# Patient Record
Sex: Female | Born: 1945 | Race: Black or African American | Smoking: Never smoker
Health system: Northeastern US, Academic
[De-identification: ages and names within clinical notes are randomized; demographics above are authoritative.]

## PROBLEM LIST (undated history)

## (undated) DIAGNOSIS — E119 Type 2 diabetes mellitus without complications: Secondary | ICD-10-CM

## (undated) DIAGNOSIS — I10 Essential (primary) hypertension: Secondary | ICD-10-CM

## (undated) DIAGNOSIS — C50919 Malignant neoplasm of unspecified site of unspecified female breast: Secondary | ICD-10-CM

## (undated) DIAGNOSIS — E785 Hyperlipidemia, unspecified: Secondary | ICD-10-CM

## (undated) DIAGNOSIS — R251 Tremor, unspecified: Secondary | ICD-10-CM

## (undated) DIAGNOSIS — N281 Cyst of kidney, acquired: Secondary | ICD-10-CM

## (undated) DIAGNOSIS — T464X5A Adverse effect of angiotensin-converting-enzyme inhibitors, initial encounter: Secondary | ICD-10-CM

## (undated) DIAGNOSIS — B019 Varicella without complication: Secondary | ICD-10-CM

## (undated) HISTORY — PX: BREAST LUMPECTOMY: SHX2

## (undated) HISTORY — DX: Type 2 diabetes mellitus without complications: E11.9

## (undated) HISTORY — PX: BREAST SURGERY: SHX581

## (undated) HISTORY — DX: Essential (primary) hypertension: I10

## (undated) NOTE — Progress Notes (Signed)
Formatting of this note might be different from the original.  This RT came into patient's room for vent check.  Patient's FIO2 was set at 21%.  There was no note of change.  Order had not been changed from 100% FIO2 earlier in day.  An ABG was done and the RT on dayshift had put FIO2 at 40%, but no documentation was available for change to 21%.  Changed FIO2 back to what was recorded until an order is changed.  Patient is currently in no distress, Will continue to monitor.  Electronically signed by Laurier Nancy, RRT at 06/12/2019  8:18 PM EDT

## (undated) NOTE — Progress Notes (Signed)
Formatting of this note might be different from the original.  PCCM Note :   Called by RN b/p trending up now 197/92. Did not take b/p meds this am. No improvement with labetolol . Bradycardia earlier today with precedex so now off precedex .   Patient is alert and f/c . No headaces. Denies pain.     Plan   D/c labetolol .   Begin Cardene Drip , goal b/p <160   KVO IVF to avoid volume overload     Tammy Parrett NP-C   [facility]   [phone]   06/12/2019    Electronically signed by Alyson Reedy, MD at 06/16/2019 11:01 PM EDT

## (undated) NOTE — H&P (Signed)
Formatting of this note is different from the original.  Images from the original note were not included.      NAME:  Christina [name], MRN:  [id number], DOB:  [birth year], LOS: 0  ADMISSION DATE:  06/12/2019, CONSULTATION DATE:  06/12/2019  REFERRING MD:  EDP - Dykstra, CHIEF COMPLAINT:  Acute respiratory failure due to ACE angioedema     Brief History    62 year old female with history of HTN who is on lisinopril who woke up on the morning of presentation with tongue swelling.  Patient presented to [facility] with swelling.  Tongue was enlarged enough to be beyond the lips and patient was unable to close her mouth.  Patient was transferred ER to ER where anesthesia, ENT and CCM were bedside.  Upon evaluating the patient and discussion amongst the group decision was made to take the patient to the ER with anesthesia performing and ENT bedside an endotracheal intubation then admission to the ICU for CCM to stabilize but attempt to avoid a tracheostomy at this time.    History of present illness    2 year old female with history of HTN who is on lisinopril who woke up on the morning of presentation with tongue swelling.  Patient presented to [facility] ER with swelling.  Tongue was enlarged enough to be beyond the lips and patient was unable to close her mouth.  Patient was transferred ER to ER where anesthesia, ENT and CCM were bedside.  Upon evaluating the patient and discussion amongst the group decision was made to take the patient to the ER with anesthesia performing and ENT bedside an endotracheal intubation then admission to the ICU for CCM to stabilize but attempt to avoid a tracheostomy at this time.    Past Medical History   HTN    Significant Hospital Events    10/4 acute respiratory failure from angioedema    Consults:   PCCM  Anesthesia  ENT    Procedures:   Intubation 10/4>>>    Significant Diagnostic Tests:   N/A    Micro Data:   N/A    Antimicrobials:   N/A     Interim  history/subjective:   Unable to obtain since patient is unable to speak with such a large tongue    Objective    Blood pressure (!) 191/96, pulse 92, resp. rate 13, weight 79.4 kg, SpO2 98 %.        Intake/Output Summary (Last 24 hours) at 06/12/2019 1236  Last data filed at 06/12/2019 1120  Gross per 24 hour   Intake 50 ml   Output --   Net 50 ml     Filed Weights    06/12/19 1041   Weight: 79.4 kg     Examination:  General: Acutely ill appearing female, some respiratory distress due to tongue size  HENT: NC/AT, PERRL, EOM-I and MMM.  Very enlarged tongue that is literally outside her mouth and she can not close her mouth  Lungs: Diminished but clear bilaterally  Cardiovascular: RRR, Nl S1/S2 and -M/R/G  Abdomen: Soft, NT, ND and +BS  Extremities: -edema and -tenderness  Neuro: Alert and interactive, moving all ext to command  Skin: intact    Resolved Hospital Problem list    N/A    Assessment & Plan:   49 year old female with ACE induced angioedema and respiratory failure.  Patient is to go to the OR for likely nasal intubation then transfer intubated  to the ICU.  Discussed with ENT and anesthesia.    Angioedema:   - Solumedrol 60 q4   - Pepcid 20 IV BID   - Benadryl   - Add ACE to allergy list    VDRF:   - PRVC   - ABG and CXR   - Titrate O2 for sat of 88-92%   - ABG and CXR in AM    Sedation:   - Fentanyl drip   - Precedex drip    Consult nutrition for TF per nutrition  Order admission labs  SQ heparin    Labs    CBC:  No results for input(s): WBC, NEUTROABS, HGB, HCT, MCV, PLT in the last 168 hours.    Basic Metabolic Panel:  No results for input(s): NA, K, CL, CO2, GLUCOSE, BUN, CREATININE, CALCIUM, MG, PHOS in the last 168 hours.  GFR:  CrCl cannot be calculated (No successful lab value found.).  No results for input(s): PROCALCITON, WBC, LATICACIDVEN in the last 168 hours.    Liver Function Tests:  No results for input(s): AST, ALT, ALKPHOS, BILITOT, PROT, ALBUMIN in the last 168 hours.  No results for  input(s): LIPASE, AMYLASE in the last 168 hours.  No results for input(s): AMMONIA in the last 168 hours.    ABG  No results found for: PHART, PCO2ART, PO2ART, HCO3, TCO2, ACIDBASEDEF, O2SAT     Coagulation Profile:  No results for input(s): INR, PROTIME in the last 168 hours.    Cardiac Enzymes:  No results for input(s): CKTOTAL, CKMB, CKMBINDEX, TROPONINI in the last 168 hours.    HbA1C:  No results found for: HGBA1C    CBG:  No results for input(s): GLUCAP in the last 168 hours.    Review of Systems:    Unattainable due to enlarged tongue and now sedation    Past Medical History   She,  has a past medical history of Diabetes mellitus without complication (HCC) and Hypertension.     Surgical History    Unknown     Social History       Family History    Her family history is not on file.     Allergies  Allergies   Allergen Reactions   ? Lisinopril Anaphylaxis   ? Acetaminophen Hives     Causes wheels and hives       Home Medications   Prior to Admission medications    Medication Sig Start Date End Date Taking? Authorizing Provider   lisinopril (ZESTRIL) 10 MG tablet Take 10 mg by mouth daily.   Yes [provider]     The patient is critically ill with multiple organ systems failure and requires high complexity decision making for assessment and support, frequent evaluation and titration of therapies, application of advanced monitoring technologies and extensive interpretation of multiple databases.     Critical Care Time devoted to patient care services described in this note is  45  Minutes. This time reflects time of care of this signee Dr Koren Bound. This critical care time does not reflect procedure time, or teaching time or supervisory time of PA/NP/Med student/Med Resident etc but could involve care discussion time.    Alyson Reedy, M.D.  [facility].  Pager: [phone].  After hours pager: [phone].  Electronically signed by Alyson Reedy, MD at 06/12/2019  1:21 PM  EDT

## (undated) NOTE — Progress Notes (Signed)
Formatting of this note is different from the original.  Inpatient Diabetes Program Recommendations    AACE/ADA: New Consensus Statement on Inpatient Glycemic Control (2015)    Target Ranges:  Prepandial:   less than 140 mg/dL       Peak postprandial:   less than 180 mg/dL (1-2 hours)       Critically ill patients:  140 - 180 mg/dL     Lab Results   Component Value Date    GLUCAP 248 (H) 06/13/2019    HGBA1C 12.5 (H) 06/13/2019     Review of Glycemic Control  Results for JAYMEE, CELA (MRN [id number]) as of 06/13/2019 13:48   Ref. Range 06/12/2019 19:24 06/12/2019 23:44 06/13/2019 03:55 06/13/2019 07:26 06/13/2019 12:02   Glucose-Capillary Latest Ref Range: 70 - 99 mg/dL 409 (H) 811 (H) 914 (H) 211 (H) 248 (H)     Diabetes history: DM 2  Outpatient Diabetes medications:   Lantus 14 units q HS  Current orders for Inpatient glycemic control:  Novolog moderate q 4 hours  Lantus 5 units daily  Solumedrol 60 mg IV q 6 hours    Inpatient Diabetes Program Recommendations:      Please consider increasing Lantus to 15 units daily. It appears that patient was newly started on insulin prior to admit on 06/06/19.   Likely will need insulin increased while in the hospital.   Will follow up on 06/14/19 regarding A1C.     Thanks,   Beryl Meager, RN, BC-ADM  Inpatient Diabetes Coordinator  Pager [phone] (8a-5p)      Electronically signed by Oneida Arenas, RN at 06/13/2019  1:54 PM EDT

## (undated) NOTE — ED Provider Notes (Signed)
Formatting of this note is different from the original.  Images from the original note were not included.    [facility] EMERGENCY DEPARTMENT  Provider Note    CSN: [id number]  Arrival date & time: 06/12/19  1035        History    Chief Complaint  Chief Complaint   Patient presents with   ? Angioedema     HPI  Christina [name] is a 72 y.o. female.        The history is provided by the patient. No language interpreter was used.     Christina [name] is a 59 y.o. female who presents to the Emergency Department complaining of tongue swelling.  Level V caveat due to speech difficulties.  Pt reports tongue swelling that began about 9 am and rapidly worsened prior to ED arrival. She took two benadryl when she noticed the swelling.  Denies any swelling in her throat, chest pain, SOB, N/V.  No prior similar sxs. She has a hx/o HTN and takes an Ace inhibitor.  Sxs are severe and constant.    Past Medical History:   Diagnosis Date   ? Diabetes mellitus without complication (HCC)    ? Hypertension      There are no active problems to display for this patient.        OB History    No obstetric history on file.      Home Medications      Prior to Admission medications    Medication Sig Start Date End Date Taking? Authorizing Provider   lisinopril (ZESTRIL) 10 MG tablet Take 10 mg by mouth daily.   Yes [provider]     Family History  No family history on file.    Social History  Social History     Tobacco Use   ? Smoking status: Not on file   Substance Use Topics   ? Alcohol use: Not on file   ? Drug use: Not on file     Allergies    Patient has no allergy information on record.    Review of Systems  Review of Systems   All other systems reviewed and are negative.    Physical Exam  Updated Vital Signs  BP (!) 182/109 (BP Location: Right Arm)   Pulse (!) 103   Resp (!) 22   Wt 79.4 kg   SpO2 100%     Physical Exam  Vitals signs and nursing note reviewed.   Constitutional:       General: She is in acute distress.       Appearance: She is well-developed. She is ill-appearing.   HENT:      Head: Normocephalic and atraumatic.      Comments: Marked edema to tongue, filling entire oral cavity, unable to follow close teeth, drooling. Marked edema of submental, sublingual region.    Cardiovascular:      Rate and Rhythm: Normal rate and regular rhythm.      Heart sounds: No murmur.   Pulmonary:      Effort: Pulmonary effort is normal. No respiratory distress.      Breath sounds: Normal breath sounds. No stridor.   Abdominal:      Palpations: Abdomen is soft.      Tenderness: There is no abdominal tenderness. There is no guarding or rebound.   Musculoskeletal:         General: No swelling or tenderness.   Skin:     General: Skin  is warm and dry.   Neurological:      Mental Status: She is alert and oriented to person, place, and time.      Sensory: No sensory deficit.   Psychiatric:         Behavior: Behavior normal.     ED Treatments / Results   Labs  (all labs ordered are listed, but only abnormal results are displayed)  Labs Reviewed - No data to display    EKG  None    Radiology  No results found.    Procedures  Procedures (including critical care time)  CRITICAL CARE  Performed by: Tilden Fossa    Total critical care time: 45 minutes    Critical care time was exclusive of separately billable procedures and treating other patients.    Critical care was necessary to treat or prevent imminent or life-threatening deterioration.    Critical care was time spent personally by me on the following activities: development of treatment plan with patient and/or surrogate as well as nursing, discussions with consultants, evaluation of patient's response to treatment, examination of patient, obtaining history from patient or surrogate, ordering and performing treatments and interventions, ordering and review of laboratory studies, ordering and review of radiographic studies, pulse oximetry and re-evaluation of patient's condition.    Medications  Ordered in ED  Medications   diphenhydrAMINE (BENADRYL) 50 MG/ML injection (has no administration in time range)   famotidine (PEPCID) IVPB 20 mg premix (20 mg Intravenous New Bag/Given 06/12/19 1056)   EPINEPHrine (EPI-PEN) injection 0.3 mg (0.3 mg Intramuscular Given 06/12/19 1051)   methylPREDNISolone sodium succinate (SOLU-MEDROL) 125 mg/2 mL injection 125 mg (125 mg Intravenous Given 06/12/19 1052)     Initial Impression / Assessment and Plan / ED Course   I have reviewed the triage vital signs and the nursing notes.    Pertinent labs & imaging results that were available during my care of the patient were reviewed by me and considered in my medical decision making (see chart for details).    Clinical Course as of Jun 11 1613   Wynelle Link Jun 12, 2019   1208 ENT [facility]), myself at bedside on patient arrival; critical care [place] [place]) and anesthesia soon joined, patient will be taken to the OR for intubation and admitted to ICU afterwards    [RD]   1208 Patient with profound angioedema, breathing through nose, normal pulse oximetry, suctioning secretions    [RD]   1220 Updated daughter regarding status and plan for intubation    [RD]     Clinical Course User Index  [RD] Milagros Loll, MD       Pt with angioedema.  Significant involvement of anterior airway, which would make standard DL or video laryngoscopy impossible.  She was treated with steroids, pepcid, epi for potential allergic component but favor ACE induced angioedema as cause. She had very minimal improvement in swelling with epi.  D/w pt risks and benefits of TXA and she declines.  Plan to transfer emergency traffic to [facility] ED for further specialist mgmt, Dr. Stevie Kern accepts.      Discussed with patient's daughter, Dr. Glennie Isle patient's illness and plan for transfer for further treatment.    Final Clinical Impressions(s) / ED Diagnoses     Final diagnoses:   Angioedema, initial encounter     ED Discharge Orders     None         Tilden Fossa,  MD  06/12/19 1634    Electronically signed by  Tilden Fossa, MD at 06/12/2019  4:34 PM EDT

## (undated) NOTE — ED Notes (Signed)
Formatting of this note might be different from the original.  ED Provider at bedside.  Electronically signed by Kathryne Eriksson, RN at 06/12/2019 10:42 AM EDT

## (undated) NOTE — ED Provider Notes (Signed)
Formatting of this note might be different from the original.  Update note    Please refer to Dr. Haskel Schroeder note on same day for full ED provider note.  In brief, patient presented initially to med [facility] with sudden onset of tongue swelling concerning for angioedema, likely secondary to lisinopril use.  She was given epi, Solu-Medrol, pepcid, benadryl at [facility] with no significant change.  After I received initial call from Dr. Madilyn Hook, I notified anesthesia, ENT Lazarus Salines), critical care [place]).  All teams came to bedside upon patient's arrival at [facility].  After discussion with these teams, plan was developed to have patient go to the operating room for definitive airway management and admission to the ICU thereafter.    Milagros Loll, MD  06/12/19 1234    Electronically signed by Milagros Loll, MD at 06/12/2019 12:34 PM EDT

## (undated) NOTE — ED Notes (Signed)
Formatting of this note might be different from the original.  Pt arrived from [facility] with increased secretions and difficulty speaking.    Electronically signed by Doneta Public, RN at 06/12/2019 12:08 PM EDT

## (undated) NOTE — Procedures (Signed)
Formatting of this note might be different from the original.  Extubation Procedure Note    Patient Details:    Name: Christina [name]  DOB: 03-30-1946  MRN: [id number]    Airway Documentation:     Vent end date: 06/13/19 Vent end time: 0915     Evaluation   O2 sats: stable throughout  Complications: No apparent complications  Patient did tolerate procedure well.  Bilateral Breath Sounds: Clear, Diminished    Yes     RT extubated patient per MD order [name] 3L NC with RN at bedside. Positive cuff leak noted. Patient tolerated well and can speak. No stridor noted. Patient says her breathing is comfortable. RT will continue [name] monitor as needed.      Lura Em  06/13/2019, 9:23 AM    Electronically signed by Lura Em, RRT at 06/13/2019  9:27 AM EDT

## (undated) NOTE — Progress Notes (Signed)
Formatting of this note is different from the original.  Images from the original note were not included.      NAME:  Christina [name], MRN:  [id number], DOB:  08/26/1946, LOS: 2  ADMISSION DATE:  06/12/2019, CONSULTATION DATE:  06/12/2019  REFERRING MD:  EDP - Dykstra, CHIEF COMPLAINT:  Acute respiratory failure due to ACE angioedema     Brief History    43 yo female with hx of HTN on lisinopril had dental work on 06/10/19.  Also started on zetia 1 day prior to symptom onset.  Developed tongue swelling and difficulty breathing on 06/11/19 that progressed on 06/12/19.  Presented to [facility] ER.  Transferred to [facility] ER and had nasal intubation.  Started on solumedrol, pepcid, and benadryl.    Past Medical History   HTN, HLD, DM, Tremor, Rt breast cancer, Renal cyst, Varicella    Significant Hospital Events    10/4 Admit with acute respiratory failure from angioedema  10/5 Extubated  10/6 D/c home    Consults:   Anesthesia  ENT    Procedures:   Nasal Intubation 10/4 >> 10/5    Significant Diagnostic Tests:       Micro Data:   COVID 10/4 >> negative    Antimicrobials:       Interim history/subjective:   No problems swallowing.  Speech improved.  Walked in room w/o difficulty.  Passing urine w/o difficulty.  Denies chest pain, dyspnea, abdominal pain.    Objective    Blood pressure (!) 151/75, pulse 78, temperature 98.3 F (36.8 C), temperature source Oral, resp. rate 16, height 5\' 6"  (1.676 m), weight 77.5 kg, SpO2 97 %.        Intake/Output Summary (Last 24 hours) at 06/14/2019 0931  Last data filed at 06/14/2019 0300  Gross per 24 hour   Intake 1748.97 ml   Output 525 ml   Net 1223.97 ml     Filed Weights    06/12/19 1041 06/13/19 0215   Weight: 79.4 kg 77.5 kg     Examination:    General - alert  Eyes - pupils reactive  ENT - no sinus tenderness, no stridor  Cardiac - regular rate/rhythm, no murmur  Chest - equal breath sounds b/l, no wheezing or rales  Abdomen - soft, non tender, + bowel sounds  Extremities - no  cyanosis, clubbing, or edema  Skin - no rashes  Neuro - normal strength, moves extremities, follows commands  Psych - normal mood and behavior    Assessment & Plan:     ACE inhibitor induced angioedema with tongue swelling.  - wean off prednisone over next few days  - benadryl d/c'ed due to mild delirium on 10/5  - continue pepcid for now    Hypertension.  - continue HCTZ  - f/u with PCP (Dr. Donia Ast with [facility]) within one week to further adjust BP regimen  - suspect BP will improve some once she is off steroids    DM type II with steroid induced hyperglycemia.  - continue lantus  - resume metformin    HLD.  - would not resume zetia    Labs      CMP Latest Ref Rng & Units 06/14/2019 06/13/2019 06/13/2019   Glucose 70 - 99 mg/dL 295(A) 213(Y) -   BUN 8 - 23 mg/dL 22 21 -   Creatinine 8.65 - 1.00 mg/dL 7.84 6.96 -   Sodium 295 - 145 mmol/L 138 140 138   Potassium  3.5 - 5.1 mmol/L 3.7 2.9(L) 3.0(L)   Chloride 98 - 111 mmol/L 103 102 -   CO2 22 - 32 mmol/L 24 23 -   Calcium 8.9 - 10.3 mg/dL 1.6(X) 0.9(U) -   Total Protein 6.5 - 8.1 g/dL - - -   Total Bilirubin 0.3 - 1.2 mg/dL - - -   Alkaline Phos 38 - 126 U/L - - -   AST 15 - 41 U/L - - -   ALT 0 - 44 U/L - - -     CBC Latest Ref Rng & Units 06/14/2019 06/13/2019 06/13/2019   WBC 4.0 - 10.5 K/uL 10.1 7.0 -   Hemoglobin 12.0 - 15.0 g/dL 04.5 40.9 81.1   Hematocrit 36.0 - 46.0 % 36.4 37.2 38.0   Platelets 150 - 400 K/uL 222 219 -     ABG    Component Value Date/Time    PHART 7.499 (H) 06/13/2019 0410    PCO2ART 31.1 (L) 06/13/2019 0410    PO2ART 184.0 (H) 06/13/2019 0410    HCO3 24.2 06/13/2019 0410    TCO2 25 06/13/2019 0410    O2SAT 100.0 06/13/2019 0410     CBG (last 3)   Recent Labs     06/13/19  2352 06/14/19  0405 06/14/19  0926   GLUCAP 211* 154* 209*     Coralyn Helling, MD  [facility]  06/14/2019, 9:43 AM      Electronically signed by Coralyn Helling, MD at 06/14/2019 11:07 AM EDT

## (undated) NOTE — ED Notes (Signed)
Formatting of this note might be different from the original.  Daughter updated by this RN and EDP.  Her contact number is [phone]   Electronically signed by Doneta Public, RN at 06/12/2019 12:19 PM EDT

## (undated) NOTE — Discharge Summary (Signed)
Formatting of this note is different from the original.  Images from the original note were not included.  Physician Discharge Summary         Patient ID:  Christina [name]  MRN: [id number]  DOB/AGE: 1946/05/24 36 y.o.    Admit date: 06/12/2019  Discharge date: 06/14/2019    Discharge Diagnoses:      ACEi induced Angioedema  Acute respiratory insufficiency  Hypertension  Diabetes Type 2 / hyperglycemia    Discharge summary      53 year old female with history of HTN, HLD, hand tremors, breast cancer (2018) and diabetes type II who is on lisinopril who woke up on the morning of presentation with tongue swelling.  Patient presented to [facility] ER with swelling on 10/4.  Tongue was enlarged enough to be beyond the lips and patient was unable to close her mouth.  Patient was transferred ER to ER where anesthesia, ENT and CCM were bedside.  Upon evaluating the patient and discussion amongst the group decision was made to take the patient to the OR with anesthesia performing intubation and ENT bedside for backup endotracheal intubation or possible emergency tracheostomy.  She was successful nasally intubated by anesthesia and then transferred to ICU.  She was treated with IV solumedrol, pepcid, and benadryl.  Her hyperglycemia was controlled with sliding scale insulin and lantus.  Initial hypertension treated with cardene IV which was stopped early on 10/5.  Patient was able to be successfully extubated on 10/5 after angioedema subsided.  Unclear cause of angioedema, possibly related to ACEi however patient also states she recently started zetia after her annual visit on 06/06/2019.  Patient has remained hemodynamically stable, airway stable, and able to tolerate diet since extubation.  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  She will be discharged home and is to follow-up with her primary care physician, Dr. Aline August next week (followed at [facility]).  ACEi and zetia have  been added to her allergies and acknowledges to avoid ARBs as well.     Discharge Plan by Active Problems      ACEi induced Angioedema and respiratory insufficiency   - no residual dysphagia/ stridor/ swelling  P:   No ACEi/ ARB ever  No Zetia   Continue steroid taper over 5 days  Acknowledges to return to ER immediately if any changes    HTN  P:   Restarted on home HCTZ 25 mg daily  Continue pre-admit ASA 81mg  and KCL  To f/u with primary MD in 1 week  Encouraged to monitor BP at home, daily weights and low sodium diet    DMT2 uncontrolled  - A1C 12.5  P:   Continue home metformin  Resume home lantus, further recommendations per primary care  Patient will monitor her CBGs at home    HLD  P:   No Zetia, PCP to determine agent    Significant Hospital tests/ studies     Procedures    10/4 Nasal intubation >> 10/5    Culture data/antimicrobials    COVID 10/4 >> negative    Consults   Anesthesia  ENT     Discharge Exam:  BP (!) 151/75 (BP Location: Right Arm)   Pulse 60   Temp 98.3 F (36.8 C) (Oral)   Resp 12   Ht 5\' 6"  (1.676 m)   Wt 77.5 kg   SpO2 98%   BMI 27.58 kg/m  General:  Pleasant elderly female sitting upright in bed in NAD  HEENT: MM pink/moist, no oral swelling or stridor appreciated  Neuro: Alert and oriented, non focal, MAE  CV: s1s2 , no m/r/g  PULM:  CTA, non labored, room air  GI: soft, bsx4 active   Extremities: warm/dry, no edema   Skin: no rashes or lesions    Labs at discharge     Lab Results   Component Value Date    CREATININE 0.72 06/14/2019    BUN 22 06/14/2019    NA 138 06/14/2019    K 3.7 06/14/2019    CL 103 06/14/2019    CO2 24 06/14/2019     Lab Results   Component Value Date    WBC 10.1 06/14/2019    HGB 12.4 06/14/2019    HCT 36.4 06/14/2019    MCV 86.9 06/14/2019    PLT 222 06/14/2019     Lab Results   Component Value Date    ALT 14 06/12/2019    AST 18 06/12/2019    ALKPHOS 73 06/12/2019    BILITOT 1.2 06/12/2019     Lab Results   Component Value Date    INR 1.1 06/12/2019      Current radiological studies      Dg Chest Port 1 View    Result Date: 06/13/2019  CLINICAL DATA:  Endotracheal tube placement EXAM: PORTABLE CHEST 1 VIEW COMPARISON:  06/12/2019 FINDINGS: The endotracheal tube terminates approximately 4.4 cm above the carina. The heart size is stable. The previously noted airspace opacity in the left lower/left mid lung zone has significantly improved from prior study. The aortic calcifications are noted. There is no pneumothorax. No large pleural effusion. IMPRESSION: 1. Lines and tubes as above. 2. Interval improvement in the previously demonstrated atelectasis in the left mid lung zone. Electronically Signed   By: Katherine Mantle M.D.   On: 06/13/2019 07:57     Dg Chest Port 1 View    Result Date: 06/12/2019  CLINICAL DATA:  7 year old female with history of respiratory failure. EXAM: PORTABLE CHEST 1 VIEW COMPARISON:  No priors. FINDINGS: An endotracheal tube is in place with tip 5.6 cm above the carina. Lung volumes are low. Linear opacity in the left mid lung, likely subsegmental atelectasis. No acute consolidative airspace disease. No pleural effusions. No evidence of pulmonary edema. Heart size is normal. The patient is rotated to the right on today's exam, resulting in distortion of the mediastinal contours and reduced diagnostic sensitivity and specificity for mediastinal pathology. Atherosclerosis in the thoracic aorta. IMPRESSION: 1. Support apparatus, as above. 2. Low lung volumes with subsegmental atelectasis or scarring in the left mid lung. 3. Aortic atherosclerosis. Electronically Signed   By: Trudie Reed M.D.   On: 06/12/2019 15:21     Disposition:      Discharge disposition: 01-Home or Self Care    Allergies as of 06/14/2019       Reactions    Acetaminophen Hives, Swelling, Other (See Comments)    Lisinopril Anaphylaxis    Eggs Or Egg-derived Products Other (See Comments)    10.5.2020 Patient reports that she can tolerate the flu vaccine    Zetia  [ezetimibe]     Possible cause of angioedema        Medication List     STOP taking these medications    diphenhydrAMINE 25 mg capsule  Commonly known as: BENADRYL    ezetimibe 10 MG tablet  Commonly known as: ZETIA  lisinopril 20 MG tablet  Commonly known as: ZESTRIL      TAKE these medications    aspirin EC 81 MG tablet  Take 81 mg by mouth daily.    hydrochlorothiazide 25 MG tablet  Commonly known as: HYDRODIURIL  Take 25 mg by mouth daily.    Lantus SoloStar 100 UNIT/ML Solostar Pen  Generic drug: Insulin Glargine  Inject 14 Units into the skin at bedtime.    potassium chloride SA 20 MEQ tablet  Commonly known as: KLOR-CON  Take 20 mEq by mouth 2 (two) times daily.    predniSONE 10 MG tablet  Commonly known as: DELTASONE  Take 5 tablets (50 mg total) by mouth daily for 1 day, THEN 4 tablets (40 mg total) daily for 1 day, THEN 3 tablets (30 mg total) daily for 1 day, THEN 2 tablets (20 mg total) daily for 1 day, THEN 1 tablet (10 mg total) daily for 1 day.  Start taking on: June 15, 2019    propranolol ER 80 MG 24 hr capsule  Commonly known as: INDERAL LA  Take 80 mg by mouth daily as needed (TREMORS).      continue home metformin     Follow-up appointment    Dr. Rodena Medin at [facility]  06/22/2019 at 10 am  [phone]    Discharge Condition:     stable    Posey Boyer, MSN, AGACNP-BC  [facility]  Pgr: [phone] or if no answer [phone]  06/14/2019, 10:29 AM      Electronically signed by Coralyn Helling, MD at 06/14/2019 11:07 AM EDT    Associated attestation - Coralyn Helling, MD - 06/14/2019 11:07 AM EDT  Formatting of this note might be different from the original.  Concern for ACE inhibitor or zetia as cause of angioedema.    Resume HCTZ.  Wean off prednisone.    F/u with PCP in one week.    Coralyn Helling, MD  [facility]  06/14/2019, 11:07 AM

## (undated) NOTE — ED Notes (Signed)
Formatting of this note might be different from the original.  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

## (undated) NOTE — ED Notes (Signed)
Formatting of this note might be different from the original.  ED Provider at bedside.  Electronically signed by Kathryne Eriksson, RN at 06/12/2019 11:14 AM EDT

## (undated) NOTE — Progress Notes (Signed)
Formatting of this note might be different from the original.  Removed patient's IV in RFA. Patient dressed with assistance from daughter. I discussed discharge paperwork with daughter and patient together. All questions answered and denies any further needs. She ambulated very well out of unit with daughter  Electronically signed by Theodis Blaze, RN at 06/14/2019 10:51 AM EDT

## (undated) NOTE — ED Notes (Signed)
Formatting of this note might be different from the original.  Dr Madilyn Hook spoke with daughter, Tamera Reason on phone  Electronically signed by Kathryne Eriksson, RN at 06/12/2019 11:13 AM EDT

## (undated) NOTE — Unmapped External Note (Signed)
Formatting of this note might be different from the original.  06/12/2019    8:13 PM    Armanda Magic, Alvis Lemmings    [id number]    Pre-Op Dx:  Mouth angioedema    Post-op Dx:  same    Proc:  ENT standby     Surg:  Cephus Richer MD    Anes:  GNT    EBL:  none    Comp:  none    Findings:  Swollen base of tongue.  Normal supraglottic larynx with mobile vocal cords.    Procedure: waited in attendance in the OR while Anesthesia successfully performed a RIGHT nasal intubation.  No tracheostomy necessary    Dispo:   OR to ICU    Plan:  Angioedema management per CCM.    Flo Shanks T MD    Electronically signed by Flo Shanks, MD at 06/12/2019  8:16 PM EDT

## (undated) NOTE — Unmapped External Note (Signed)
Formatting of this note is different from the original.  [place]  52 y.o., female  [id number]      Chief Complaint: tongue swelling    HPI: 41 yo bf on Lisinopril x yrs.  Onset tongue swelling this AM.  Breathing OK .  Mild tongue pain.  Difficulty swallowing.  No prior similar episodes.  No new meds or suspect food ingestion.      PMH:  Past Medical History:   Diagnosis Date   ? Diabetes mellitus without complication (HCC)    ? Hypertension      Surg Hx:    FHx:  No family history on file.  SocHx:  has no history on file for tobacco, alcohol, and drug.    ALLERGIES:   Allergies   Allergen Reactions   ? Acetaminophen Hives, Swelling and Other (See Comments)     Allergy is NOT contained to only "Tylenol"- also causes welts/wheals, also   ? Lisinopril Anaphylaxis   ? Eggs Or Egg-Derived Products Other (See Comments)     Patient cannot eat eggs- ASK PATIENT (daughter uncertain)     Medications Prior to Admission   Medication Sig Dispense Refill   ? diphenhydrAMINE (BENADRYL) 25 mg capsule Take 50 mg by mouth as needed (for allergic reactions).     ? lisinopril (ZESTRIL) 10 MG tablet Take 10 mg by mouth daily.       Results for orders placed or performed during the hospital encounter of 06/12/19 (from the past 48 hour(s))   SARS Coronavirus 2 [facility] order, Performed in [facility] hospital lab) Nasopharyngeal Nasopharyngeal Swab     Status: None    Collection Time: 06/12/19 12:13 PM    Specimen: Nasopharyngeal Swab   Result Value Ref Range    SARS Coronavirus 2 NEGATIVE NEGATIVE     Comment: (NOTE)  If result is NEGATIVE  SARS-CoV-2 target nucleic acids are NOT DETECTED.  The SARS-CoV-2 RNA is generally detectable in upper and lower   respiratory specimens during the acute phase of infection. The lowest   concentration of SARS-CoV-2 viral copies this assay can detect is 250   copies / mL. A negative result does not preclude SARS-CoV-2 infection   and should not be used as the sole basis for treatment or other    patient management decisions.  A negative result may occur with   improper specimen collection / handling, submission of specimen other   than nasopharyngeal swab, presence of viral mutation(s) within the   areas targeted by this assay, and inadequate number of viral copies   (<250 copies / mL). A negative result must be combined with clinical   observations, patient history, and epidemiological information.  If result is POSITIVE  SARS-CoV-2 target nucleic acids are DETECTED.  The SARS-CoV-2 RNA is generally detectable in upper and lower   respiratory specimens dur  ing the acute phase of infection.  Positive   results are indicative of active infection with SARS-CoV-2.  Clinical   correlation with patient history and other diagnostic information is   necessary to determine patient infection status.  Positive results do   not rule out bacterial infection or co-infection with other viruses.  If result is PRESUMPTIVE POSTIVE  SARS-CoV-2 nucleic acids MAY BE PRESENT.    A presumptive positive result was obtained on the submitted specimen   and confirmed on repeat testing.  While 2019 novel coronavirus   (SARS-CoV-2) nucleic acids may be present in the submitted sample   additional  confirmatory testing may be necessary for epidemiological   and / or clinical management purposes  to differentiate between   SARS-CoV-2 and other Sarbecovirus currently known to infect humans.   If clinically indicated additional testing with an alternate test   methodology [phone]) is advised. The SARS-CoV-2 RNA is generally   detectable in upper and lower respiratory sp  ecimens during the acute   phase of infection.  The expected result is Negative.  Fact Sheet for Patients:  BoilerBrush.com.cy  Fact Sheet for Healthcare Providers:  https://pope.com/  This test is not yet approved or cleared by the [place] FDA and  has been authorized for detection and/or diagnosis of SARS-CoV-2  by  FDA under an Emergency Use Authorization (EUA).  This EUA will remain  in effect (meaning this test can be used) for the duration of the  COVID-19 declaration under Section 564(b)(1) of the Act, 21 U.S.C.  section 360bbb-3(b)(1), unless the authorization is terminated or  revoked sooner.  Performed at [facility], [address], [place], [place]  [address]    Glucose, capillary     Status: Abnormal    Collection Time: 06/12/19  2:57 PM   Result Value Ref Range    Glucose-Capillary 256 (H) 70 - 99 mg/dL   MRSA PCR Screening     Status: None    Collection Time: 06/12/19  3:20 PM    Specimen: Nasopharyngeal   Result Value Ref Range    MRSA by PCR NEGATIVE NEGATIVE     Comment:         The GeneXpert MRSA Assay (FDA  approved for NASAL specimens  only), is one component of a  comprehensive MRSA colonization  surveillance program. It is not  intended to diagnose MRSA  infection nor to guide or  monitor treatment for  MRSA infections.  Performed at [facility], [address] [place], [place] [address]    I-STAT 7, (LYTES, BLD GAS, ICA, H+H)     Status: Abnormal    Collection Time: 06/12/19  3:56 PM   Result Value Ref Range    pH, Arterial 7.423 7.350 - 7.450    pCO2 arterial 40.9 32.0 - 48.0 mmHg    pO2, Arterial 436.0 (H) 83.0 - 108.0 mmHg    Bicarbonate 26.7 20.0 - 28.0 mmol/L    TCO2 28 22 - 32 mmol/L    O2 Saturation 100.0 %    Acid-Base Excess 2.0 0.0 - 2.0 mmol/L    Sodium 136 135 - 145 mmol/L    Potassium 3.5 3.5 - 5.1 mmol/L    Calcium, Ion 1.16 1.15 - 1.40 mmol/L    HCT 40.0 36.0 - 46.0 %    Hemoglobin 13.6 12.0 - 15.0 g/dL    Patient temperature HIDE     Collection site RADIAL, ALLEN'S TEST ACCEPTABLE     Drawn by Operator     Sample type ARTERIAL    Comprehensive metabolic panel     Status: Abnormal    Collection Time: 06/12/19  3:58 PM   Result Value Ref Range    Sodium 137 135 - 145 mmol/L    Potassium 3.8 3.5 - 5.1 mmol/L    Chloride 100 98 - 111 mmol/L    CO2 22 22 - 32 mmol/L     Glucose, Bld 296 (H) 70 - 99 mg/dL    BUN 15 8 - 23 mg/dL    Creatinine, Ser 7.25 0.44 - 1.00 mg/dL  Calcium 8.7 (L) 8.9 - 10.3 mg/dL    Total Protein 6.5 6.5 - 8.1 g/dL    Albumin 3.5 3.5 - 5.0 g/dL    AST 18 15 - 41 U/L    ALT 14 0 - 44 U/L    Alkaline Phosphatase 73 38 - 126 U/L    Total Bilirubin 1.2 0.3 - 1.2 mg/dL    GFR calc non Af Amer >60 >60 mL/min    GFR calc Af Amer >60 >60 mL/min    Anion gap 15 5 - 15     Comment: Performed at [facility], [address], [place], [place] [address]   Magnesium     Status: Abnormal    Collection Time: 06/12/19  3:58 PM   Result Value Ref Range    Magnesium 1.4 (L) 1.7 - 2.4 mg/dL     Comment: Performed at [facility], [address], [place], [place] [address]   Phosphorus     Status: None    Collection Time: 06/12/19  3:58 PM   Result Value Ref Range    Phosphorus 3.1 2.5 - 4.6 mg/dL     Comment: Performed at [facility], [address] [place], [place] [address]   Lipase, blood     Status: None    Collection Time: 06/12/19  3:58 PM   Result Value Ref Range    Lipase 24 11 - 51 U/L     Comment: Performed at [facility], [address] [place], [place] [address]   CBC WITH DIFFERENTIAL     Status: Abnormal    Collection Time: 06/12/19  3:58 PM   Result Value Ref Range    WBC 6.1 4.0 - 10.5 K/uL     Comment: WHITE COUNT CONFIRMED ON SMEAR    RBC 4.86 3.87 - 5.11 MIL/uL    Hemoglobin 14.3 12.0 - 15.0 g/dL    HCT 29.5 62.1 - 30.8 %    MCV 88.3 80.0 - 100.0 fL    MCH 29.4 26.0 - 34.0 pg    MCHC 33.3 30.0 - 36.0 g/dL    RDW 65.7 84.6 - 96.2 %    Platelets PLATELET CLUMPS NOTED ON SMEAR, UNABLE TO ESTIMATE 150 - 400 K/uL    nRBC 0.0 0.0 - 0.2 %    Neutrophils Relative % 89 %    Neutro Abs 5.4 1.7 - 7.7 K/uL    Lymphocytes Relative 10 %    Lymphs Abs 0.6 (L) 0.7 - 4.0 K/uL    Monocytes Relative 1 %    Monocytes Absolute 0.1 0.1 - 1.0 K/uL    Eosinophils Relative 0 %    Eosinophils Absolute 0.0 0.0 - 0.5 K/uL    Basophils Relative 0 %     Basophils Absolute 0.0 0.0 - 0.1 K/uL    Immature Granulocytes 0 %    Abs Immature Granulocytes 0.01 0.00 - 0.07 K/uL     Comment: Performed at [facility], [address] [place], [place] [address]   Protime-INR     Status: None    Collection Time: 06/12/19  3:58 PM   Result Value Ref Range    Prothrombin Time 13.8 11.4 - 15.2 seconds    INR 1.1 0.8 - 1.2     Comment: (NOTE)  INR goal varies based on device and disease states.  Performed at [facility], [address] [place], [place]  [address]  APTT     Status: Abnormal    Collection Time: 06/12/19  3:58 PM   Result Value Ref Range    aPTT <20 (L) 24 - 36 seconds     Comment: REPEATED TO VERIFY  Performed at [facility], [address], [place], [place] [address]    Glucose, capillary     Status: Abnormal    Collection Time: 06/12/19  7:24 PM   Result Value Ref Range    Glucose-Capillary 255 (H) 70 - 99 mg/dL     Dg Chest Port 1 View    Result Date: 06/12/2019  CLINICAL DATA:  38 year old female with history of respiratory failure. EXAM: PORTABLE CHEST 1 VIEW COMPARISON:  No priors. FINDINGS: An endotracheal tube is in place with tip 5.6 cm above the carina. Lung volumes are low. Linear opacity in the left mid lung, likely subsegmental atelectasis. No acute consolidative airspace disease. No pleural effusions. No evidence of pulmonary edema. Heart size is normal. The patient is rotated to the right on today's exam, resulting in distortion of the mediastinal contours and reduced diagnostic sensitivity and specificity for mediastinal pathology. Atherosclerosis in the thoracic aorta. IMPRESSION: 1. Support apparatus, as above. 2. Low lung volumes with subsegmental atelectasis or scarring in the left mid lung. 3. Aortic atherosclerosis. Electronically Signed   By: Trudie Reed M.D.   On: 06/12/2019 15:21     Blood pressure 136/71, pulse 85, temperature 97.8 F (36.6 C), temperature source Oral, resp. rate 19, height 5\' 6"  (1.676 m),  weight 79.4 kg, SpO2 99 %.    PHYSICAL EXAM:  Overall appearance:  Heavy set. Unable to close mouth due to large tongue.    Head: NCAT  Ears: not examined  Nose:not examined  Oral Cavity: FOM elevated.  Tongue grossly swollen  Oral Pharynx/Hypopharynx/Larynx : not examined  Neuro:  Grossly nl  Neck:  Swelling in anterior upper neck    Studies Reviewed: none    Assessment/Plan  ACE inhibitor angioedema.    Will accompany Anesthesia to OR for nasal intubation vs emergent tracheostomy.      Zola Button [facility]  06/12/2019, 8:09 PM      Electronically signed by Flo Shanks, MD at 06/12/2019  8:13 PM EDT

## (undated) NOTE — ED Notes (Signed)
Formatting of this note might be different from the original.  Report given to Asher Muir RN at [facility] ED and Kandee Keen RN with [facility]   Electronically signed by Kathryne Eriksson, RN at 06/12/2019 11:36 AM EDT

## (undated) NOTE — Progress Notes (Signed)
Formatting of this note is different from the original.  Images from the original note were not included.      NAME:  Christina [name], MRN:  [id number], DOB:  01-Nov-1945, LOS: 1  ADMISSION DATE:  06/12/2019, CONSULTATION DATE:  06/12/2019  REFERRING MD:  EDP - Dykstra, CHIEF COMPLAINT:  Acute respiratory failure due to ACE angioedema     Brief History    6 y/o female with history of HTN who is on lisinopril who woke up on the morning of presentation with tongue swelling.  Patient presented to [facility] ER with swelling.  Tongue was enlarged enough to be beyond the lips and patient was unable to close her mouth.  Patient was transferred ER to ER where anesthesia, ENT and CCM were bedside.  Upon evaluating the patient and discussion amongst the group decision was made to take the patient to the ER with anesthesia performing and ENT bedside an endotracheal intubation then admission to the ICU for CCM to stabilize but attempt to avoid a tracheostomy at this time.    Past Medical History   HTN    Significant Hospital Events    10/4 Admit with acute respiratory failure from angioedema    Consults:   PCCM  Anesthesia  ENT    Procedures:   Nasal Intubation 10/4 >>     Significant Diagnostic Tests:       Micro Data:   COVID 10/4 >> negative    Antimicrobials:       Interim history/subjective:   Pt reports feeling much better, indicates tongue swelling much improved.  No acute events overnight. Sedation turned off. Patient weaning on PSV 5/5.     Objective    Blood pressure 98/82, pulse 81, temperature 99 F (37.2 C), temperature source Oral, resp. rate 20, height 5\' 6"  (1.676 m), weight 77.5 kg, SpO2 100 %.    Vent Mode: CPAP;PSV  FiO2 (%):  [21 %-100 %] 30 %  Set Rate:  [18 bmp] 18 bmp  Vt Set:  [470 mL] 470 mL  PEEP:  [5 cmH20] 5 cmH20  Pressure Support:  [5 cmH20] 5 cmH20  Plateau Pressure:  [15 cmH20-18 cmH20] 16 cmH20     Intake/Output Summary (Last 24 hours) at 06/13/2019 0839  Last data filed at 06/13/2019  0800  Gross per 24 hour   Intake 2135.06 ml   Output 250 ml   Net 1885.06 ml     Filed Weights    06/12/19 1041 06/13/19 0215   Weight: 79.4 kg 77.5 kg     Examination:  General: elderly female lying in bed in NAD on vent    HEENT: MM pink/moist, nasal intubation  Neuro: Awake, alert, interacts appropriately, writes messages   CV: s1s2 rrr, no m/r/g  PULM:  Non-labored, lungs bilaterally clear, + loud cuff leak on exam   GI: soft, bsx4 active   Extremities: warm/dry, no edema   Skin: no rashes or lesions    Resolved Hospital Problem list        Assessment & Plan:     Acute Respiratory Failure secondary to Angioedema / Upper Airway Obstruction   -significantly improved 10/5, cuff leak on exam   P:  Continue solumedrol 60 mg IV Q6, consider reduction 10/6  Pepcid 20 mg IV BID   Benadryl 25 mg Q6   No further ACE / ARB  Proceed with extubation, emergency airway equipement to bedside pre-extubation     Hypertension   P:  No  further ACE / ARB   Continue Cardene gtt for BP control  Will transition to oral agents once ok for PO's     DM   P:  Add lantus 5 units   Adjust SSI to moderate scale    Labs      CBC:  Recent Labs   Lab 06/12/19  1556 06/12/19  1558 06/13/19  0410 06/13/19  0502   WBC  --  6.1  --  7.0   NEUTROABS  --  5.4  --   --    HGB 13.6 14.3 12.9 13.0   HCT 40.0 42.9 38.0 37.2   MCV  --  88.3  --  84.9   PLT  --  PLATELET CLUMPS NOTED ON SMEAR, UNABLE TO ESTIMATE  --  219     Basic Metabolic Panel:  Recent Labs   Lab 06/12/19  1556 06/12/19  1558 06/13/19  0410 06/13/19  0502   NA 136 137 138 140   K 3.5 3.8 3.0* 2.9*   CL  --  100  --  102   CO2  --  22  --  23   GLUCOSE  --  296*  --  242*   BUN  --  15  --  21   CREATININE  --  0.65  --  0.81   CALCIUM  --  8.7*  --  8.6*   MG  --  1.4*  --  1.4*   PHOS  --  3.1  --  2.4*     GFR:  Estimated Creatinine Clearance: 65 mL/min (by C-G formula based on SCr of 0.81 mg/dL).  Recent Labs   Lab 06/12/19  1558 06/13/19  0502   WBC 6.1 7.0     Liver Function  Tests:  Recent Labs   Lab 06/12/19  1558   AST 18   ALT 14   ALKPHOS 73   BILITOT 1.2   PROT 6.5   ALBUMIN 3.5     Recent Labs   Lab 06/12/19  1558   LIPASE 24     No results for input(s): AMMONIA in the last 168 hours.    ABG    Component Value Date/Time    PHART 7.499 (H) 06/13/2019 0410    PCO2ART 31.1 (L) 06/13/2019 0410    PO2ART 184.0 (H) 06/13/2019 0410    HCO3 24.2 06/13/2019 0410    TCO2 25 06/13/2019 0410    O2SAT 100.0 06/13/2019 0410       Coagulation Profile:  Recent Labs   Lab 06/12/19  1558   INR 1.1     Cardiac Enzymes:  No results for input(s): CKTOTAL, CKMB, CKMBINDEX, TROPONINI in the last 168 hours.    HbA1C:  No results found for: HGBA1C    CBG:  Recent Labs   Lab 06/12/19  1457 06/12/19  1924 06/12/19  2344 06/13/19  0355 06/13/19  0726   GLUCAP 256* 255* 319* 225* 211*     CRITICAL CARE  Performed by: Canary Brim    Total critical care time: 30 minutes    Critical care time was exclusive of separately billable procedures and treating other patients.    Critical care was necessary to treat or prevent imminent or life-threatening deterioration.    Critical care was time spent personally by me on the following activities: development of treatment plan with patient and/or surrogate as well as nursing, discussions with consultants, evaluation of patient's response to treatment, examination of  patient, obtaining history from patient or surrogate, ordering and performing treatments and interventions, ordering and review of laboratory studies, ordering and review of radiographic studies, pulse oximetry and re-evaluation of patient's condition.    Canary Brim, NP-C  [facility]  Pgr: [phone] or if no answer [phone]  06/13/2019, 8:40 AM      Electronically signed by Coralyn Helling, MD at 06/13/2019 10:10 AM EDT    Associated attestation - Coralyn Helling, MD - 06/13/2019 10:10 AM EDT  Formatting of this note might be different from the original.  Swelling down.  Tolerating PS.    BP  (!) 142/73   Pulse 72   Temp 99 F (37.2 C) (Oral)   Resp 15   Ht 5\' 6"  (1.676 m)   Wt 77.5 kg   SpO2 99%   BMI 27.58 kg/m     Alert.  Follows commands.  No tongue/lip swelling.  +cuff leak.  HR regular.  No wheeze.  Abdomen soft.  No edema.      CXR (reviewed by me) - atelectasis    A/p:    Acute respiratory failure with hypoxia and compromised airway in setting of ACE inhibitor induced angioedema.  - extubation trial  - continue benadryl, pepcid, solumedrol for now    Hypokalemia, hypomagnesemia, hypophosphatemia.  - replace electrolytes as needed    Steroid induced hyperglycemia with hx of DM.  - SSI    HTN.  - ACE inhibitor d/c'ed  - continue cardene gtt with goal SBP < 160    Dysphagia.  - assess swallowing after extubation    CC time by me independent of APP time 32 minutes    Coralyn Helling, MD  [facility]Critical Care  06/13/2019, 10:07 AM

## (undated) NOTE — ED Triage Notes (Signed)
Formatting of this note might be different from the original.  Swelling to tongue since 0900, unable to speak, took benadryl 50mg  PTA  Electronically signed by Kathryne Eriksson, RN at 06/12/2019 11:20 AM EDT

---
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
## 1974-09-08 HISTORY — PX: MYOMECTOMY: SHX85

## 2009-09-08 DIAGNOSIS — C50919 Malignant neoplasm of unspecified site of unspecified female breast: Secondary | ICD-10-CM

## 2009-09-08 HISTORY — PX: BREAST SURGERY: SHX581

## 2009-09-08 HISTORY — PX: BREAST BIOPSY: SHX20

## 2009-09-08 HISTORY — DX: Malignant neoplasm of unspecified site of unspecified female breast: C50.919

## 2009-09-08 HISTORY — PX: BREAST LUMPECTOMY: SHX2

## 2014-05-25 DIAGNOSIS — I10 Essential (primary) hypertension: Secondary | ICD-10-CM

## 2014-05-25 DIAGNOSIS — E785 Hyperlipidemia, unspecified: Secondary | ICD-10-CM | POA: Insufficient documentation

## 2014-05-25 HISTORY — DX: Hyperlipidemia, unspecified: E78.5

## 2014-05-25 HISTORY — DX: Essential (primary) hypertension: I10

## 2015-08-13 DIAGNOSIS — E119 Type 2 diabetes mellitus without complications: Secondary | ICD-10-CM

## 2015-08-13 HISTORY — DX: Type 2 diabetes mellitus without complications: E11.9

## 2017-10-01 DIAGNOSIS — R251 Tremor, unspecified: Secondary | ICD-10-CM | POA: Insufficient documentation

## 2017-10-01 HISTORY — DX: Tremor, unspecified: R25.1

## 2018-01-26 ENCOUNTER — Inpatient Hospital Stay: Admit: 2018-01-26 | Discharge: 2018-01-26 | Disposition: A | Discharge status: Home / Self Care | Payer: Self-pay

## 2018-02-13 DIAGNOSIS — N281 Cyst of kidney, acquired: Secondary | ICD-10-CM | POA: Insufficient documentation

## 2018-02-13 HISTORY — DX: Cyst of kidney, acquired: N28.1

## 2019-02-01 ENCOUNTER — Inpatient Hospital Stay: Admit: 2019-02-01 | Discharge: 2019-02-01 | Disposition: A | Discharge status: Home / Self Care | Payer: Self-pay

## 2019-03-09 ENCOUNTER — Other Ambulatory Visit: Payer: Self-pay

## 2019-03-09 DIAGNOSIS — Z20822 Contact with and (suspected) exposure to covid-19: Secondary | ICD-10-CM

## 2019-03-16 LAB — SPECIMEN STATUS REPORT

## 2019-03-16 LAB — NOVEL CORONAVIRUS, NAA: SARS-CoV-2, NAA: NOT DETECTED

## 2019-06-09 DIAGNOSIS — T464X5A Adverse effect of angiotensin-converting-enzyme inhibitors, initial encounter: Secondary | ICD-10-CM

## 2019-06-09 DIAGNOSIS — T783XXA Angioneurotic edema, initial encounter: Secondary | ICD-10-CM

## 2019-06-09 HISTORY — DX: Angioneurotic edema, initial encounter: T78.3XXA

## 2019-06-09 HISTORY — DX: Adverse effect of angiotensin-converting-enzyme inhibitors, initial encounter: T46.4X5A

## 2019-06-12 ENCOUNTER — Emergency Department (HOSPITAL_COMMUNITY): Payer: Medicare Other | Admitting: Certified Registered"

## 2019-06-12 ENCOUNTER — Encounter (HOSPITAL_COMMUNITY)
Admission: EM | Disposition: A | Discharge status: Home / Self Care | Payer: Self-pay | Source: Home / Self Care | Attending: Pulmonary Disease

## 2019-06-12 ENCOUNTER — Encounter (HOSPITAL_BASED_OUTPATIENT_CLINIC_OR_DEPARTMENT_OTHER): Payer: Self-pay | Admitting: Emergency Medicine

## 2019-06-12 ENCOUNTER — Inpatient Hospital Stay (HOSPITAL_COMMUNITY): Payer: Medicare Other

## 2019-06-12 ENCOUNTER — Inpatient Hospital Stay (HOSPITAL_BASED_OUTPATIENT_CLINIC_OR_DEPARTMENT_OTHER)
Admission: EM | Admit: 2019-06-12 | Discharge: 2019-06-14 | DRG: 915 | Disposition: A | Discharge status: Home / Self Care | Payer: Medicare Other | Attending: Pulmonary Disease | Admitting: Pulmonary Disease

## 2019-06-12 DIAGNOSIS — J988 Other specified respiratory disorders: Secondary | ICD-10-CM

## 2019-06-12 DIAGNOSIS — T783XXA Angioneurotic edema, initial encounter: Secondary | ICD-10-CM

## 2019-06-12 DIAGNOSIS — T464X5A Adverse effect of angiotensin-converting-enzyme inhibitors, initial encounter: Secondary | ICD-10-CM | POA: Insufficient documentation

## 2019-06-12 DIAGNOSIS — E1165 Type 2 diabetes mellitus with hyperglycemia: Secondary | ICD-10-CM | POA: Diagnosis present

## 2019-06-12 DIAGNOSIS — X58XXXA Exposure to other specified factors, initial encounter: Secondary | ICD-10-CM | POA: Diagnosis present

## 2019-06-12 DIAGNOSIS — Z9289 Personal history of other medical treatment: Secondary | ICD-10-CM

## 2019-06-12 DIAGNOSIS — Z853 Personal history of malignant neoplasm of breast: Secondary | ICD-10-CM | POA: Diagnosis not present

## 2019-06-12 DIAGNOSIS — I10 Essential (primary) hypertension: Secondary | ICD-10-CM | POA: Diagnosis present

## 2019-06-12 DIAGNOSIS — E785 Hyperlipidemia, unspecified: Secondary | ICD-10-CM | POA: Diagnosis present

## 2019-06-12 DIAGNOSIS — R1310 Dysphagia, unspecified: Secondary | ICD-10-CM | POA: Diagnosis present

## 2019-06-12 DIAGNOSIS — J9600 Acute respiratory failure, unspecified whether with hypoxia or hypercapnia: Secondary | ICD-10-CM | POA: Diagnosis present

## 2019-06-12 DIAGNOSIS — T380X5A Adverse effect of glucocorticoids and synthetic analogues, initial encounter: Secondary | ICD-10-CM | POA: Diagnosis present

## 2019-06-12 DIAGNOSIS — R410 Disorientation, unspecified: Secondary | ICD-10-CM | POA: Diagnosis not present

## 2019-06-12 DIAGNOSIS — Z20828 Contact with and (suspected) exposure to other viral communicable diseases: Secondary | ICD-10-CM | POA: Diagnosis present

## 2019-06-12 DIAGNOSIS — K146 Glossodynia: Secondary | ICD-10-CM | POA: Diagnosis present

## 2019-06-12 DIAGNOSIS — J9601 Acute respiratory failure with hypoxia: Secondary | ICD-10-CM | POA: Diagnosis not present

## 2019-06-12 DIAGNOSIS — Z79899 Other long term (current) drug therapy: Secondary | ICD-10-CM | POA: Diagnosis not present

## 2019-06-12 DIAGNOSIS — Z91012 Allergy to eggs: Secondary | ICD-10-CM | POA: Diagnosis not present

## 2019-06-12 HISTORY — DX: Angioneurotic edema, initial encounter: T78.3XXA

## 2019-06-12 HISTORY — DX: Adverse effect of angiotensin-converting-enzyme inhibitors, initial encounter: T46.4X5A

## 2019-06-12 HISTORY — DX: Other specified respiratory disorders: J98.8

## 2019-06-12 HISTORY — DX: Essential (primary) hypertension: I10

## 2019-06-12 HISTORY — DX: Malignant neoplasm of unspecified site of unspecified female breast: C50.919

## 2019-06-12 HISTORY — DX: Hyperlipidemia, unspecified: E78.5

## 2019-06-12 HISTORY — DX: Varicella without complication: B01.9

## 2019-06-12 HISTORY — DX: Tremor, unspecified: R25.1

## 2019-06-12 HISTORY — DX: Type 2 diabetes mellitus without complications: E11.9

## 2019-06-12 HISTORY — DX: Cyst of kidney, acquired: N28.1

## 2019-06-12 HISTORY — PX: INTUBATION-ENDOTRACHEAL WITH TRACHEOSTOMY STANDBY: SHX6592

## 2019-06-12 LAB — COMPREHENSIVE METABOLIC PANEL
ALT: 14 U/L (ref 0–44)
AST: 18 U/L (ref 15–41)
Albumin: 3.5 g/dL (ref 3.5–5.0)
Alkaline Phosphatase: 73 U/L (ref 38–126)
Anion gap: 15 (ref 5–15)
BUN: 15 mg/dL (ref 8–23)
CO2: 22 mmol/L (ref 22–32)
Calcium: 8.7 mg/dL — ABNORMAL LOW (ref 8.9–10.3)
Chloride: 100 mmol/L (ref 98–111)
Creatinine, Ser: 0.65 mg/dL (ref 0.44–1.00)
GFR calc Af Amer: >60 mL/min (ref >60)
GFR calc non Af Amer: >60 mL/min (ref >60)
Glucose, Bld: 296 mg/dL — ABNORMAL HIGH (ref 70–99)
Potassium: 3.8 mmol/L (ref 3.5–5.1)
Sodium: 137 mmol/L (ref 135–145)
Total Bilirubin: 1.2 mg/dL (ref 0.3–1.2)
Total Protein: 6.5 g/dL (ref 6.5–8.1)

## 2019-06-12 LAB — CBC WITH DIFFERENTIAL/PLATELET
Abs Immature Granulocytes: 0.01 10*3/uL (ref 0.00–0.07)
Basophils Absolute: 0 10*3/uL (ref 0.0–0.1)
Basophils Relative: 0 %
Eosinophils Absolute: 0 10*3/uL (ref 0.0–0.5)
Eosinophils Relative: 0 %
HCT: 42.9 % (ref 36.0–46.0)
Hemoglobin: 14.3 g/dL (ref 12.0–15.0)
Immature Granulocytes: 0 %
Lymphocytes Relative: 10 %
Lymphs Abs: 0.6 10*3/uL — ABNORMAL LOW (ref 0.7–4.0)
MCH: 29.4 pg (ref 26.0–34.0)
MCHC: 33.3 g/dL (ref 30.0–36.0)
MCV: 88.3 fL (ref 80.0–100.0)
Monocytes Absolute: 0.1 10*3/uL (ref 0.1–1.0)
Monocytes Relative: 1 %
Neutro Abs: 5.4 10*3/uL (ref 1.7–7.7)
Neutrophils Relative %: 89 %
Platelets: UNDETERMINED 10*3/uL (ref 150–400)
RBC: 4.86 MIL/uL (ref 3.87–5.11)
RDW: 11.9 % (ref 11.5–15.5)
WBC: 6.1 10*3/uL (ref 4.0–10.5)
nRBC: 0 % (ref 0.0–0.2)

## 2019-06-12 LAB — POCT I-STAT 7, (LYTES, BLD GAS, ICA,H+H)
Acid-Base Excess: 2 mmol/L (ref 0.0–2.0)
Bicarbonate: 26.7 mmol/L (ref 20.0–28.0)
Calcium, Ion: 1.16 mmol/L (ref 1.15–1.40)
HCT: 40 % (ref 36.0–46.0)
Hemoglobin: 13.6 g/dL (ref 12.0–15.0)
O2 Saturation: 100 %
Potassium: 3.5 mmol/L (ref 3.5–5.1)
Sodium: 136 mmol/L (ref 135–145)
TCO2: 28 mmol/L (ref 22–32)
pCO2 arterial: 40.9 mmHg (ref 32.0–48.0)
pH, Arterial: 7.423 (ref 7.350–7.450)
pO2, Arterial: 436 mmHg — ABNORMAL HIGH (ref 83.0–108.0)

## 2019-06-12 LAB — LIPASE, BLOOD: Lipase: 24 U/L (ref 11–51)

## 2019-06-12 LAB — SARS CORONAVIRUS 2 BY RT PCR (HOSPITAL ORDER, PERFORMED IN ~~LOC~~ HOSPITAL LAB): SARS Coronavirus 2: NEGATIVE

## 2019-06-12 LAB — GLUCOSE, CAPILLARY
Glucose-Capillary: 255 mg/dL — ABNORMAL HIGH (ref 70–99)
Glucose-Capillary: 256 mg/dL — ABNORMAL HIGH (ref 70–99)
Glucose-Capillary: 319 mg/dL — ABNORMAL HIGH (ref 70–99)

## 2019-06-12 LAB — MAGNESIUM: Magnesium: 1.4 mg/dL — ABNORMAL LOW (ref 1.7–2.4)

## 2019-06-12 LAB — PROTIME-INR
INR: 1.1 (ref 0.8–1.2)
Prothrombin Time: 13.8 seconds (ref 11.4–15.2)

## 2019-06-12 LAB — PHOSPHORUS: Phosphorus: 3.1 mg/dL (ref 2.5–4.6)

## 2019-06-12 LAB — MRSA PCR SCREENING: MRSA by PCR: NEGATIVE

## 2019-06-12 LAB — APTT: aPTT: <20 seconds — ABNORMAL LOW (ref 24–36)

## 2019-06-12 SURGERY — INTUBATION-ENDOTRACHEAL WITH TRACHEOSTOMY STANDBY
Anesthesia: General | Site: Nose

## 2019-06-12 MED ORDER — FENTANYL BOLUS VIA INFUSION
25.0000 ug | INTRAVENOUS | Status: DC | PRN
  Administered 2019-06-12 – 2019-06-13 (×5): 25 ug via INTRAVENOUS
  Filled 2019-06-12: qty 25

## 2019-06-12 MED ORDER — FAMOTIDINE IN NACL 20-0.9 MG/50ML-% IV SOLN
20.0000 mg | Freq: Once | INTRAVENOUS | Status: AC
  Administered 2019-06-12: 11:00:00 20 mg via INTRAVENOUS

## 2019-06-12 MED ORDER — MIDAZOLAM HCL 2 MG/2ML IJ SOLN
INTRAMUSCULAR | Status: DC | PRN
  Administered 2019-06-12 (×2): 1 mg via INTRAVENOUS

## 2019-06-12 MED ORDER — PROPOFOL 10 MG/ML IV BOLUS
INTRAVENOUS | Status: AC
  Filled 2019-06-12: qty 20

## 2019-06-12 MED ORDER — HEPARIN SODIUM (PORCINE) 5000 UNIT/ML IJ SOLN
5000.0000 [IU] | Freq: Three times a day (TID) | INTRAMUSCULAR | Status: DC
  Administered 2019-06-12 – 2019-06-14 (×6): 5000 [IU] via SUBCUTANEOUS
  Filled 2019-06-12 (×6): qty 1

## 2019-06-12 MED ORDER — PROPOFOL 500 MG/50ML IV EMUL
INTRAVENOUS | Status: DC | PRN
  Administered 2019-06-12: 50 ug/kg/min via INTRAVENOUS
  Administered 2019-06-12: 150 ug/kg/min via INTRAVENOUS

## 2019-06-12 MED ORDER — INSULIN ASPART 100 UNIT/ML ~~LOC~~ SOLN: 2.0000 [IU] | SUBCUTANEOUS | Status: DC

## 2019-06-12 MED ORDER — LABETALOL HCL 5 MG/ML IV SOLN
5.0000 mg | INTRAVENOUS | Status: DC | PRN
  Administered 2019-06-12: 18:00:00 5 mg via INTRAVENOUS
  Filled 2019-06-12: qty 4

## 2019-06-12 MED ORDER — EPINEPHRINE 0.3 MG/0.3ML IJ SOAJ
INTRAMUSCULAR | Status: AC
  Filled 2019-06-12: qty 0.3

## 2019-06-12 MED ORDER — FENTANYL CITRATE (PF) 100 MCG/2ML IJ SOLN: 25.0000 ug | Freq: Once | INTRAMUSCULAR | Status: AC

## 2019-06-12 MED ORDER — DIPHENHYDRAMINE HCL 50 MG/ML IJ SOLN
25.0000 mg | Freq: Four times a day (QID) | INTRAMUSCULAR | Status: DC
  Administered 2019-06-12 – 2019-06-13 (×4): 25 mg via INTRAVENOUS
  Filled 2019-06-12 (×4): qty 1

## 2019-06-12 MED ORDER — METHYLPREDNISOLONE SODIUM SUCC 125 MG IJ SOLR
125.0000 mg | Freq: Once | INTRAMUSCULAR | Status: AC
  Administered 2019-06-12: 11:00:00 125 mg via INTRAVENOUS

## 2019-06-12 MED ORDER — METHYLPREDNISOLONE SODIUM SUCC 125 MG IJ SOLR
60.0000 mg | Freq: Four times a day (QID) | INTRAMUSCULAR | Status: DC
  Administered 2019-06-12: 60 mg via INTRAVENOUS
  Filled 2019-06-12: qty 2

## 2019-06-12 MED ORDER — SODIUM CHLORIDE 0.9 % IV SOLN
INTRAVENOUS | Status: DC
  Administered 2019-06-12: 15:00:00 via INTRAVENOUS

## 2019-06-12 MED ORDER — FENTANYL 2500MCG IN NS 250ML (10MCG/ML) PREMIX INFUSION
25.0000 ug/h | INTRAVENOUS | Status: DC
  Administered 2019-06-12 – 2019-06-13 (×2): 50 ug/h via INTRAVENOUS
  Filled 2019-06-12 (×3): qty 250

## 2019-06-12 MED ORDER — METHYLPREDNISOLONE SODIUM SUCC 125 MG IJ SOLR
INTRAMUSCULAR | Status: AC
  Administered 2019-06-12: 11:00:00 125 mg via INTRAVENOUS
  Filled 2019-06-12: qty 2

## 2019-06-12 MED ORDER — KETAMINE HCL 50 MG/5ML IJ SOSY
PREFILLED_SYRINGE | INTRAMUSCULAR | Status: AC
  Filled 2019-06-12: qty 5

## 2019-06-12 MED ORDER — SUCCINYLCHOLINE CHLORIDE 200 MG/10ML IV SOSY
PREFILLED_SYRINGE | INTRAVENOUS | Status: AC
  Filled 2019-06-12: qty 10

## 2019-06-12 MED ORDER — DEXMEDETOMIDINE HCL IN NACL 200 MCG/50ML IV SOLN
0.0000 ug/kg/h | INTRAVENOUS | Status: DC
  Filled 2019-06-12: qty 50

## 2019-06-12 MED ORDER — DIPHENHYDRAMINE HCL 50 MG/ML IJ SOLN
INTRAMUSCULAR | Status: AC
  Filled 2019-06-12: qty 1

## 2019-06-12 MED ORDER — KETAMINE HCL 10 MG/ML IJ SOLN
INTRAMUSCULAR | Status: DC | PRN
  Administered 2019-06-12: 20 mg via INTRAVENOUS
  Administered 2019-06-12: 10 mg via INTRAVENOUS
  Administered 2019-06-12: 20 mg via INTRAVENOUS

## 2019-06-12 MED ORDER — FAMOTIDINE IN NACL 20-0.9 MG/50ML-% IV SOLN
20.0000 mg | Freq: Two times a day (BID) | INTRAVENOUS | Status: DC
  Administered 2019-06-12 – 2019-06-13 (×4): 20 mg via INTRAVENOUS
  Filled 2019-06-12 (×5): qty 50

## 2019-06-12 MED ORDER — ROCURONIUM BROMIDE 100 MG/10ML IV SOLN
INTRAVENOUS | Status: DC | PRN
  Administered 2019-06-12: 20 mg via INTRAVENOUS
  Administered 2019-06-12: 30 mg via INTRAVENOUS

## 2019-06-12 MED ORDER — INSULIN ASPART 100 UNIT/ML ~~LOC~~ SOLN: 0.0000 [IU] | SUBCUTANEOUS | Status: DC

## 2019-06-12 MED ORDER — EPINEPHRINE 0.3 MG/0.3ML IJ SOAJ
0.3000 mg | Freq: Once | INTRAMUSCULAR | Status: AC
  Administered 2019-06-12: 11:00:00 0.3 mg via INTRAMUSCULAR

## 2019-06-12 MED ORDER — DIPHENHYDRAMINE HCL 50 MG/ML IJ SOLN
25.0000 mg | Freq: Four times a day (QID) | INTRAMUSCULAR | Status: DC
  Administered 2019-06-12: 15:00:00 25 mg via INTRAVENOUS
  Filled 2019-06-12: qty 1

## 2019-06-12 MED ORDER — MIDAZOLAM HCL 2 MG/2ML IJ SOLN
INTRAMUSCULAR | Status: AC
  Filled 2019-06-12: qty 2

## 2019-06-12 MED ORDER — FAMOTIDINE IN NACL 20-0.9 MG/50ML-% IV SOLN
INTRAVENOUS | Status: AC
  Filled 2019-06-12: qty 50

## 2019-06-12 MED ORDER — NICARDIPINE HCL IN NACL 20-0.86 MG/200ML-% IV SOLN
3.0000 mg/h | INTRAVENOUS | Status: DC
  Administered 2019-06-12: 23:00:00 10 mg/h via INTRAVENOUS
  Administered 2019-06-12: 18:00:00 5 mg/h via INTRAVENOUS
  Administered 2019-06-12: 21:00:00 10 mg/h via INTRAVENOUS
  Administered 2019-06-13: 03:00:00 5 mg/h via INTRAVENOUS
  Administered 2019-06-13: 01:00:00 7.5 mg/h via INTRAVENOUS
  Administered 2019-06-13: 06:00:00 3 mg/h via INTRAVENOUS
  Filled 2019-06-12 (×8): qty 200

## 2019-06-12 MED ORDER — OXYMETAZOLINE HCL 0.05 % NA SOLN
NASAL | Status: AC
  Filled 2019-06-12: qty 30

## 2019-06-12 MED ORDER — LACTATED RINGERS IV SOLN
INTRAVENOUS | Status: DC | PRN
  Administered 2019-06-12: 13:00:00 via INTRAVENOUS

## 2019-06-12 MED ORDER — GLYCOPYRROLATE 0.2 MG/ML IJ SOLN
INTRAMUSCULAR | Status: DC | PRN
  Administered 2019-06-12: 0.4 mg via INTRAVENOUS

## 2019-06-12 MED ORDER — OXYMETAZOLINE HCL 0.05 % NA SOLN
NASAL | Status: DC | PRN
  Administered 2019-06-12: 2 via NASAL

## 2019-06-12 MED ORDER — DEXMEDETOMIDINE HCL IN NACL 400 MCG/100ML IV SOLN
0.4000 ug/kg/h | INTRAVENOUS | Status: DC
  Administered 2019-06-12: 15:00:00 0.4 ug/kg/h via INTRAVENOUS
  Filled 2019-06-12: qty 100

## 2019-06-12 MED ORDER — CHLORHEXIDINE GLUCONATE CLOTH 2 % EX PADS
6.0000 | MEDICATED_PAD | Freq: Every day | CUTANEOUS | Status: DC
  Administered 2019-06-12 – 2019-06-14 (×3): 6 via TOPICAL

## 2019-06-12 MED ORDER — PROPOFOL 10 MG/ML IV BOLUS
INTRAVENOUS | Status: DC | PRN
  Administered 2019-06-12: 50 mg via INTRAVENOUS
  Administered 2019-06-12: 150 mg via INTRAVENOUS

## 2019-06-12 MED ORDER — LIDOCAINE 2% (20 MG/ML) 5 ML SYRINGE
INTRAMUSCULAR | Status: AC
  Filled 2019-06-12: qty 5

## 2019-06-12 MED ORDER — METHYLPREDNISOLONE SODIUM SUCC 125 MG IJ SOLR
60.0000 mg | Freq: Four times a day (QID) | INTRAMUSCULAR | Status: DC
  Administered 2019-06-12 – 2019-06-14 (×7): 60 mg via INTRAVENOUS
  Filled 2019-06-12 (×7): qty 2

## 2019-06-12 MED ORDER — INSULIN ASPART 100 UNIT/ML ~~LOC~~ SOLN
0.0000 [IU] | SUBCUTANEOUS | Status: DC
  Administered 2019-06-12: 7 [IU] via SUBCUTANEOUS
  Administered 2019-06-12: 18:00:00 5 [IU] via SUBCUTANEOUS
  Administered 2019-06-13 (×2): 3 [IU] via SUBCUTANEOUS

## 2019-06-12 SURGICAL SUPPLY — 18 items
BLADE SURG 11 STRL SS (BLADE) IMPLANT
BLADE SURG 15 STRL LF DISP TIS (BLADE) IMPLANT
BLADE SURG 15 STRL SS (BLADE)
CLEANER TIP ELECTROSURG 2X2 (MISCELLANEOUS) IMPLANT
DRAPE HALF SHEET 40X57 (DRAPES) IMPLANT
ELECT COATED BLADE 2.86 ST (ELECTRODE) IMPLANT
GLOVE ECLIPSE 8.0 STRL XLNG CF (GLOVE) IMPLANT
GOWN STRL REUS W/ TWL LRG LVL3 (GOWN DISPOSABLE) IMPLANT
GOWN STRL REUS W/ TWL XL LVL3 (GOWN DISPOSABLE) IMPLANT
GOWN STRL REUS W/TWL LRG LVL3 (GOWN DISPOSABLE)
GOWN STRL REUS W/TWL XL LVL3 (GOWN DISPOSABLE)
KIT BASIN OR (CUSTOM PROCEDURE TRAY) IMPLANT
NS IRRIG 1000ML POUR BTL (IV SOLUTION) IMPLANT
PAD ARMBOARD 7.5X6 YLW CONV (MISCELLANEOUS) ×4 IMPLANT
PENCIL BUTTON HOLSTER BLD 10FT (ELECTRODE) IMPLANT
SPONGE DRAIN TRACH 4X4 STRL 2S (GAUZE/BANDAGES/DRESSINGS) IMPLANT
SUT SILK 2 0 SH CR/8 (SUTURE) IMPLANT
TRAY ENT MC OR (CUSTOM PROCEDURE TRAY) IMPLANT

## 2019-06-12 NOTE — ED Notes (Signed)
ED Provider at bedside. 

## 2019-06-12 NOTE — ED Provider Notes (Signed)
[facility] Provider Note   CSN: [id number] Arrival date & time: 06/12/19  1035     History   Chief Complaint Chief Complaint  Patient presents with  . Angioedema    HPI Maria Santiago is a 73 y.o. female.     The history is provided by the patient. No language interpreter was used.   Maria Santiago is a 73 y.o. female who presents to the Emergency Department complaining of tongue swelling.  Level V caveat due to speech difficulties.  Pt reports tongue swelling that began about 9 am and rapidly worsened prior to ED arrival. She took two benadryl when she noticed the swelling.  Denies any swelling in her throat, chest pain, SOB, N/V.  No prior similar sxs. She has a hx/o HTN and takes an Ace inhibitor.  Sxs are severe and constant.   Past Medical History:  Diagnosis Date  . Diabetes mellitus without complication ([place])   . Hypertension     There are no active problems to display for this patient.      OB History   No obstetric history on file.      Home Medications    Prior to Admission medications   Medication Sig Start Date End Date Taking? Authorizing Provider  lisinopril (ZESTRIL) 10 MG tablet Take 10 mg by mouth daily.   Yes [provider]    Family History No family history on file.  Social History Social History   Tobacco Use  . Smoking status: Not on file  Substance Use Topics  . Alcohol use: Not on file  . Drug use: Not on file     Allergies   Patient has no allergy information on record.   Review of Systems Review of Systems  All other systems reviewed and are negative.    Physical Exam Updated Vital Signs BP (!) 182/109 (BP Location: Right Arm)   Pulse (!) 103   Resp (!) 22   Wt 79.4 kg   SpO2 100%   Physical Exam Vitals signs and nursing note reviewed.  Constitutional:      General: She is in acute distress.     Appearance: She is well-developed. She is ill-appearing.  HENT:     Head:  Normocephalic and atraumatic.     Comments: Marked edema to tongue, filling entire oral cavity, unable to follow close teeth, drooling. Marked edema of submental, sublingual region.   Cardiovascular:     Rate and Rhythm: Normal rate and regular rhythm.     Heart sounds: No murmur.  Pulmonary:     Effort: Pulmonary effort is normal. No respiratory distress.     Breath sounds: Normal breath sounds. No stridor.  Abdominal:     Palpations: Abdomen is soft.     Tenderness: There is no abdominal tenderness. There is no guarding or rebound.  Musculoskeletal:        General: No swelling or tenderness.  Skin:    General: Skin is warm and dry.  Neurological:     Mental Status: She is alert and oriented to person, place, and time.     Sensory: No sensory deficit.  Psychiatric:        Behavior: Behavior normal.      ED Treatments / Results  Labs (all labs ordered are listed, but only abnormal results are displayed) Labs Reviewed - No data to display  EKG None  Radiology No results found.  Procedures Procedures (including critical care time) CRITICAL CARE Performed  by: Quintella Reichert   Total critical care time: 45 minutes  Critical care time was exclusive of separately billable procedures and treating other patients.  Critical care was necessary to treat or prevent imminent or life-threatening deterioration.  Critical care was time spent personally by me on the following activities: development of treatment plan with patient and/or surrogate as well as nursing, discussions with consultants, evaluation of patient's response to treatment, examination of patient, obtaining history from patient or surrogate, ordering and performing treatments and interventions, ordering and review of laboratory studies, ordering and review of radiographic studies, pulse oximetry and re-evaluation of patient's condition.  Medications Ordered in ED Medications  diphenhydrAMINE (BENADRYL) 50 MG/ML  injection (has no administration in time range)  famotidine (PEPCID) IVPB 20 mg premix (20 mg Intravenous New Bag/Given 06/12/19 1056)  EPINEPHrine (EPI-PEN) injection 0.3 mg (0.3 mg Intramuscular Given 06/12/19 1051)  methylPREDNISolone sodium succinate (SOLU-MEDROL) 125 mg/2 mL injection 125 mg (125 mg Intravenous Given 06/12/19 1052)     Initial Impression / Assessment and Plan / ED Course  I have reviewed the triage vital signs and the nursing notes.  Pertinent labs & imaging results that were available during my care of the patient were reviewed by me and considered in my medical decision making (see chart for details).  Clinical Course as of Jun 11 1613  Sun Oct 04, [id number]  0000000 ENT [facility]), myself at bedside on patient arrival; critical care Nelda Marseille) and anesthesia soon joined, patient will be taken to the OR for intubation and admitted to ICU afterwards   [RD]  1208 Patient with profound angioedema, breathing through nose, normal pulse oximetry, suctioning secretions   [RD]  1220 Updated daughter regarding status and plan for intubation   [RD]    Clinical Course User Index [RD] Lucrezia Starch, MD      Pt with angioedema.  Significant involvement of anterior airway, which would make standard DL or video laryngoscopy impossible.  She was treated with steroids, pepcid, epi for potential allergic component but favor ACE induced angioedema as cause. She had very minimal improvement in swelling with epi.  D/w pt risks and benefits of TXA and she declines.  Plan to transfer emergency traffic to [facility] ED for further specialist mgmt, Dr. Roslynn Amble accepts.    Discussed with patient's daughter, Dr. Roselyn Meier patient's illness and plan for transfer for further treatment.   Final Clinical Impressions(s) / ED Diagnoses   Final diagnoses:  Angioedema, initial encounter    ED Discharge Orders    None       Quintella Reichert, MD 06/12/19 [id number]

## 2019-06-12 NOTE — Consult Note (Signed)
Maria Santiago, Maria Santiago 73 y.o., female [id number]     Chief Complaint: tongue swelling  HPI: 73 yo bf on Lisinopril x yrs.  Onset tongue swelling this AM.  Breathing OK .  Mild tongue pain.  Difficulty swallowing.  No prior similar episodes.  No new meds or suspect food ingestion.    PMH: Past Medical History:  Diagnosis Date  . Diabetes mellitus without complication ([place])   . Hypertension     Surg Hx:  FHx:  No family history on file. SocHx:  has no history on file for tobacco, alcohol, and drug.  ALLERGIES:  Allergies  Allergen Reactions  . Acetaminophen Hives, Swelling and Other (See Comments)    Allergy is NOT contained to only "Tylenol"- also causes welts/wheals, also  . Lisinopril Anaphylaxis  . Eggs Or Egg-Derived Products Other (See Comments)    Patient cannot eat eggs- ASK PATIENT (daughter uncertain)    Medications Prior to Admission  Medication Sig Dispense Refill  . diphenhydrAMINE (BENADRYL) 25 mg capsule Take 50 mg by mouth as needed (for allergic reactions).    Marland Kitchen lisinopril (ZESTRIL) 10 MG tablet Take 10 mg by mouth daily.      Results for orders placed or performed during the hospital encounter of 06/12/19 (from the past 48 hour(s))  SARS Coronavirus 2 [facility] order, Performed in [facility] hospital lab) Nasopharyngeal Nasopharyngeal Swab     Status: None   Collection Time: 06/12/19 12:13 PM   Specimen: Nasopharyngeal Swab  Result Value Ref Range   SARS Coronavirus 2 NEGATIVE NEGATIVE    Comment: (NOTE) If result is NEGATIVE SARS-CoV-2 target nucleic acids are NOT DETECTED. The SARS-CoV-2 RNA is generally detectable in upper and lower  respiratory specimens during the acute phase of infection. The lowest  concentration of SARS-CoV-2 viral copies this assay can detect is 250  copies / mL. A negative result does not preclude SARS-CoV-2 infection  and should not be used as the sole basis for treatment or other  patient management decisions.  A negative result  may occur with  improper specimen collection / handling, submission of specimen other  than nasopharyngeal swab, presence of viral mutation(s) within the  areas targeted by this assay, and inadequate number of viral copies  (<250 copies / mL). A negative result must be combined with clinical  observations, patient history, and epidemiological information. If result is POSITIVE SARS-CoV-2 target nucleic acids are DETECTED. The SARS-CoV-2 RNA is generally detectable in upper and lower  respiratory specimens dur ing the acute phase of infection.  Positive  results are indicative of active infection with SARS-CoV-2.  Clinical  correlation with patient history and other diagnostic information is  necessary to determine patient infection status.  Positive results do  not rule out bacterial infection or co-infection with other viruses. If result is PRESUMPTIVE POSTIVE SARS-CoV-2 nucleic acids MAY BE PRESENT.   A presumptive positive result was obtained on the submitted specimen  and confirmed on repeat testing.  While 2019 novel coronavirus  (SARS-CoV-2) nucleic acids may be present in the submitted sample  additional confirmatory testing may be necessary for epidemiological  and / or clinical management purposes  to differentiate between  SARS-CoV-2 and other Sarbecovirus currently known to infect humans.  If clinically indicated additional testing with an alternate test  methodology [phone]) is advised. The SARS-CoV-2 RNA is generally  detectable in upper and lower respiratory sp ecimens during the acute  phase of infection. The expected result is Negative. Fact Sheet for Patients:  StrictlyIdeas.no Fact Sheet for Healthcare Providers: BankingDealers.co.za This test is not yet approved or cleared by the [place] FDA and has been authorized for detection and/or diagnosis of SARS-CoV-2 by FDA under an Emergency Use Authorization (EUA).   This EUA will remain in effect (meaning this test can be used) for the duration of the COVID-19 declaration under Section 564(b)(1) of the Act, 21 U.S.C. section 360bbb-3(b)(1), unless the authorization is terminated or revoked sooner. Performed at [facility], [place] [address], [place], [place] [address]   Glucose, capillary     Status: Abnormal   Collection Time: 06/12/19  2:57 PM  Result Value Ref Range   Glucose-Capillary 256 (H) 70 - 99 mg/dL  MRSA PCR Screening     Status: None   Collection Time: 06/12/19  3:20 PM   Specimen: Nasopharyngeal  Result Value Ref Range   MRSA by PCR NEGATIVE NEGATIVE    Comment:        The GeneXpert MRSA Assay (FDA approved for NASAL specimens only), is one component of a comprehensive MRSA colonization surveillance program. It is not intended to diagnose MRSA infection nor to guide or monitor treatment for MRSA infections. Performed at [facility], [place] [address] [place], [place] [address]   I-STAT 7, (LYTES, BLD GAS, ICA, H+H)     Status: Abnormal   Collection Time: 06/12/19  3:56 PM  Result Value Ref Range   pH, Arterial 7.423 7.350 - 7.450   pCO2 arterial 40.9 32.0 - 48.0 mmHg   pO2, Arterial 436.0 (H) 83.0 - 108.0 mmHg   Bicarbonate 26.7 20.0 - 28.0 mmol/L   TCO2 28 22 - 32 mmol/L   O2 Saturation 100.0 %   Acid-Base Excess 2.0 0.0 - 2.0 mmol/L   Sodium 136 135 - 145 mmol/L   Potassium 3.5 3.5 - 5.1 mmol/L   Calcium, Ion 1.16 1.15 - 1.40 mmol/L   HCT 40.0 36.0 - 46.0 %   Hemoglobin 13.6 12.0 - 15.0 g/dL   Patient temperature HIDE    Collection site RADIAL, ALLEN'S TEST ACCEPTABLE    Drawn by Operator    Sample type ARTERIAL   Comprehensive metabolic panel     Status: Abnormal   Collection Time: 06/12/19  3:58 PM  Result Value Ref Range   Sodium 137 135 - 145 mmol/L   Potassium 3.8 3.5 - 5.1 mmol/L   Chloride 100 98 - 111 mmol/L   CO2 22 22 - 32 mmol/L   Glucose, Bld 296 (H) 70 - 99 mg/dL   BUN 15 8 - 23 mg/dL    Creatinine, Ser 0.65 0.44 - 1.00 mg/dL   Calcium 8.7 (L) 8.9 - 10.3 mg/dL   Total Protein 6.5 6.5 - 8.1 g/dL   Albumin 3.5 3.5 - 5.0 g/dL   AST 18 15 - 41 U/L   ALT 14 0 - 44 U/L   Alkaline Phosphatase 73 38 - 126 U/L   Total Bilirubin 1.2 0.3 - 1.2 mg/dL   GFR calc non Af Amer >60 >60 mL/min   GFR calc Af Amer >60 >60 mL/min   Anion gap 15 5 - 15    Comment: Performed at [facility], [place] [address] [place], [place] [address]  Magnesium     Status: Abnormal   Collection Time: 06/12/19  3:58 PM  Result Value Ref Range   Magnesium 1.4 (L) 1.7 - 2.4 mg/dL    Comment: Performed at [facility]  [address] [place], [place] [address]  Phosphorus     Status: None   Collection Time: 06/12/19  3:58 PM  Result Value Ref Range   Phosphorus 3.1 2.5 - 4.6 mg/dL    Comment: Performed at [facility] [address], [place], [place] [address]  Lipase, blood     Status: None   Collection Time: 06/12/19  3:58 PM  Result Value Ref Range   Lipase 24 11 - 51 U/L    Comment: Performed at [facility], [place] [address] [place], [place] [address]  CBC WITH DIFFERENTIAL     Status: Abnormal   Collection Time: 06/12/19  3:58 PM  Result Value Ref Range   WBC 6.1 4.0 - 10.5 K/uL    Comment: WHITE COUNT CONFIRMED ON SMEAR   RBC 4.86 3.87 - 5.11 MIL/uL   Hemoglobin 14.3 12.0 - 15.0 g/dL   HCT 42.9 36.0 - 46.0 %   MCV 88.3 80.0 - 100.0 fL   MCH 29.4 26.0 - 34.0 pg   MCHC 33.3 30.0 - 36.0 g/dL   RDW 11.9 11.5 - 15.5 %   Platelets PLATELET CLUMPS NOTED ON SMEAR, UNABLE TO ESTIMATE 150 - 400 K/uL   nRBC 0.0 0.0 - 0.2 %   Neutrophils Relative % 89 %   Neutro Abs 5.4 1.7 - 7.7 K/uL   Lymphocytes Relative 10 %   Lymphs Abs 0.6 (L) 0.7 - 4.0 K/uL   Monocytes Relative 1 %   Monocytes Absolute 0.1 0.1 - 1.0 K/uL   Eosinophils Relative 0 %   Eosinophils Absolute 0.0 0.0 - 0.5 K/uL   Basophils Relative 0 %   Basophils Absolute 0.0 0.0 - 0.1 K/uL   Immature Granulocytes 0 %    Abs Immature Granulocytes 0.01 0.00 - 0.07 K/uL    Comment: Performed at [facility], [place] [address] [place], [place] [address]  Protime-INR     Status: None   Collection Time: 06/12/19  3:58 PM  Result Value Ref Range   Prothrombin Time 13.8 11.4 - 15.2 seconds   INR 1.1 0.8 - 1.2    Comment: (NOTE) INR goal varies based on device and disease states. Performed at [facility], [place] [address] [place], [place] [address]   APTT     Status: Abnormal   Collection Time: 06/12/19  3:58 PM  Result Value Ref Range   aPTT <20 (L) 24 - 36 seconds    Comment: REPEATED TO VERIFY Performed at [facility], [place] [address] [place], [place] [address]   Glucose, capillary     Status: Abnormal   Collection Time: 06/12/19  7:24 PM  Result Value Ref Range   Glucose-Capillary 255 (H) 70 - 99 mg/dL   Dg Chest Port 1 View  Result Date: 06/12/2019 CLINICAL DATA:  73 year old female with history of respiratory failure. EXAM: PORTABLE CHEST 1 VIEW COMPARISON:  No priors. FINDINGS: An endotracheal tube is in place with tip 5.6 cm above the carina. Lung volumes are low. Linear opacity in the left mid lung, likely subsegmental atelectasis. No acute consolidative airspace disease. No pleural effusions. No evidence of pulmonary edema. Heart size is normal. The patient is rotated to the right on today's exam, resulting in distortion of the mediastinal contours and reduced diagnostic sensitivity and specificity for mediastinal pathology. Atherosclerosis in the thoracic aorta. IMPRESSION: 1. Support apparatus, as above. 2. Low lung volumes with subsegmental atelectasis or scarring in the left mid  lung. 3. Aortic atherosclerosis. Electronically Signed   By: Vinnie Langton M.D.   On: 06/12/2019 15:21      Blood pressure 136/71, pulse 85, temperature 97.8 F (36.6 C), temperature source Oral, resp. rate 19, height 5\' 6"  (1.676 m), weight 79.4 kg, SpO2 99 %.  PHYSICAL EXAM: Overall  appearance:  Heavy set. Unable to close mouth due to large tongue.   Head: NCAT Ears: not examined Nose:not examined Oral Cavity: FOM elevated.  Tongue grossly swollen Oral Pharynx/Hypopharynx/Larynx : not examined Neuro:  Grossly nl Neck:  Swelling in anterior upper neck  Studies Reviewed: none    Assessment/Plan ACE inhibitor angioedema.  Will accompany Anesthesia to OR for nasal intubation vs emergent tracheostomy.    Ileene Hutchinson [facility] [id number], [id number] PM

## 2019-06-12 NOTE — ED Notes (Signed)
Dr Ralene Bathe spoke with daughter, Juline Patch on phone

## 2019-06-12 NOTE — ED Notes (Addendum)
Report given to Roselyn Reef RN at [facility] ED and [facility] with [facility]

## 2019-06-12 NOTE — ED Triage Notes (Addendum)
Swelling to tongue since 0900, unable to speak, took benadryl 50mg  PTA

## 2019-06-12 NOTE — ED Notes (Signed)
Pt arrived from [facility] with increased secretions and difficulty speaking.

## 2019-06-12 NOTE — Anesthesia Postprocedure Evaluation (Signed)
Anesthesia Post Note  Patient: Engineer, structural  Procedure(s) Performed: PLACEMENT OF NASAL ENDOTRACHEAL TUBE UNDER ANESTHESIA (N/A Nose)     Patient location during evaluation: ICU Anesthesia Type: General Level of consciousness: sedated and patient remains intubated per anesthesia plan Pain management: pain level controlled Vital Signs Assessment: post-procedure vital signs reviewed and stable Respiratory status: patient remains intubated per anesthesia plan Cardiovascular status: stable Postop Assessment: no apparent nausea or vomiting Anesthetic complications: no    Last Vitals:  Vitals:   06/12/19 1411 06/12/19 1414  BP:    Pulse:  93  Resp:  18  SpO2: 100% 100%    Last Pain:  Vitals:   06/12/19 1211  PainSc: 0-No pain                 Audry Pili

## 2019-06-12 NOTE — ED Provider Notes (Signed)
Update note  Please refer to Dr. Zenia Resides note on same day for full ED provider note.  In brief, patient presented initially to [place] with sudden onset of tongue swelling concerning for angioedema, likely secondary to lisinopril use.  She was given epi, Solu-Medrol, pepcid, benadryl at [facility] with no significant change.  After I received initial call from Dr. Ralene Bathe, I notified anesthesia, ENT Erik Obey), critical care Nelda Marseille).  All teams came to bedside upon patient's arrival at [facility].  After discussion with these teams, plan was developed to have patient go to the operating room for definitive airway management and admission to the ICU thereafter.   Lucrezia Starch, MD 06/12/19 [phone]

## 2019-06-12 NOTE — Anesthesia Procedure Notes (Signed)
Procedure Name: Intubation Date/Time: 06/12/2019 12:59 PM Performed by: Audry Pili, MD Pre-anesthesia Checklist: Patient identified, Emergency Drugs available, Suction available and Patient being monitored Patient Re-evaluated:Patient Re-evaluated prior to induction Oxygen Delivery Method: Circle system utilized Preoxygenation: Pre-oxygenation with 100% oxygen Induction Type: IV induction Ventilation: Nasal airway inserted- appropriate to patient size Grade View: Grade I Nasal Tubes: Nasal prep performed and Nasal Rae Tube size: 7.0 mm Number of attempts: 1 Airway Equipment and Method: Fiberoptic brochoscope Placement Confirmation: ETT inserted through vocal cords under direct vision,  positive ETCO2 and breath sounds checked- equal and bilateral Tube secured with: Tape Difficulty Due To: Difficult Airway-  due to edematous airway and Difficult Airway- due to limited oral opening Comments: Called by ED physician regarding patient with angioedema. ENT also called for assistance/backup. After assessing patient in ED, felt patient was stable for transport up to operating room for intubation. Verbal consent obtained from patient for fiberoptic nasal intubation with backup plan of tracheostomy. Upon arrival to the OR, patient sedated slightly with versed and ketamine as documented. Right nare anesthestized progressively with cetacaine spray, followed by 4% atomized lidocaine. Nasal airway inserted prior to intubation to confirm patency of nare. Fiberoptic scope advanced slowly. Edematous upper airway, severely swollen tongue and oral tissues. Once pharynx located, sprayed cords with 2% lidocaine. Fiberoptic scope advanced atraumatically into trachea. Nasal tube slowly advanced over fiberoptic scope. Once tube was in place, propofol bolused as documented.

## 2019-06-12 NOTE — ED Notes (Addendum)
Daughter updated by this RN and EDP.  Her contact number is [phone]

## 2019-06-12 NOTE — Progress Notes (Addendum)
PCCM Note :  Called by RN b/p trending up now 197/92. Did not take b/p meds this am. No improvement with labetolol . Bradycardia earlier today with precedex so now off precedex .  Patient is alert and f/c . No headaces. Denies pain.   Plan  D/c labetolol .  Begin Cardene Drip , goal b/p <160  KVO IVF to avoid volume overload    Envy Meno NP-C  [facility]  [phone]  06/12/2019

## 2019-06-12 NOTE — Progress Notes (Signed)
This RT came into patient's room for vent check.  Patient's FIO2 was set at 21%.  There was no note of change.  Order had not been changed from 100% FIO2 earlier in day.  An ABG was done and the RT on dayshift had put FIO2 at 40%, but no documentation was available for change to 21%.  Changed FIO2 back to what was recorded until an order is changed.  Patient is currently in no distress, Will continue to monitor.

## 2019-06-12 NOTE — Op Note (Signed)
06/12/2019  8:13 PM    [name]  [id number]   Pre-Op Dx:  Mouth angioedema  Post-op Dx:  same  Proc:  ENT standby   Surg:  Tyson Alias MD  Anes:  GNT  EBL:  none  Comp:  none  Findings:  Swollen base of tongue.  Normal supraglottic larynx with mobile vocal cords.  Procedure: waited in attendance in the OR while Anesthesia successfully performed a RIGHT nasal intubation.  No tracheostomy necessary  Dispo:   OR to ICU  Plan:  Angioedema management per CCM.  Tyson Alias MD

## 2019-06-12 NOTE — Transfer of Care (Signed)
Immediate Anesthesia Transfer of Care Note  Patient: Engineer, structural  Procedure(s) Performed: PLACEMENT OF NASAL ENDOTRACHEAL TUBE UNDER ANESTHESIA (N/A Nose)  Patient Location: icu  Anesthesia Type:General  Level of Consciousn sedated   Airway & Oxygen Therapy: Patient remains intubated per anesthesia plan and Patient placed on Ventilator (see vital sign flow sheet for setting)  Post-op Assessment: Report given to RN and Post -op Vital signs reviewed and stable  Post vital signs: Reviewed and stable  Last Vitals:  Vitals Value Taken Time  BP 184/98 06/12/19 1417  Temp    Pulse 93 06/12/19 1421  Resp 18 06/12/19 1421  SpO2 100 % 06/12/19 1421  Vitals shown include unvalidated device data.  Last Pain:  Vitals:   06/12/19 1211  PainSc: 0-No pain         Complications: No apparent anesthesia complications

## 2019-06-12 NOTE — H&P (Signed)
NAME:  Maria Santiago, MRN:  [id number], DOB:  [birth year], LOS: 0 ADMISSION DATE:  06/12/2019, CONSULTATION DATE:  06/12/2019 REFERRING MD:  EDP - Dykstra, CHIEF COMPLAINT:  Acute respiratory failure due to ACE angioedema   Brief History   73 year old female with history of HTN who is on lisinopril who woke up on the morning of presentation with tongue swelling.  Patient presented [facility] with swelling.  Tongue was enlarged enough to be beyond the lips and patient was unable to close her mouth.  Patient was transferred ER to ER where anesthesia, ENT and CCM were bedside.  Upon evaluating the patient and discussion amongst the group decision was made to take the patient to the ER with anesthesia performing and ENT bedside an endotracheal intubation then admission to the ICU for CCM to stabilize but attempt to avoid a tracheostomy at this time.  History of present illness   73 year old female with history of HTN who is on lisinopril who woke up on the morning of presentation with tongue swelling.  Patient presented to [facility] ER with swelling.  Tongue was enlarged enough to be beyond the lips and patient was unable to close her mouth.  Patient was transferred ER to ER where anesthesia, ENT and CCM were bedside.  Upon evaluating the patient and discussion amongst the group decision was made to take the patient to the ER with anesthesia performing and ENT bedside an endotracheal intubation then admission to the ICU for CCM to stabilize but attempt to avoid a tracheostomy at this time.  Past Medical History  HTN  Significant Hospital Events   10/4 acute respiratory failure from angioedema  Consults:  PCCM Anesthesia ENT  Procedures:  Intubation 10/4>>>  Significant Diagnostic Tests:  N/A  Micro Data:  N/A  Antimicrobials:  N/A   Interim history/subjective:  Unable to obtain since patient is unable to speak with such a large tongue  Objective   Blood  pressure (!) 191/96, pulse 92, resp. rate 13, weight 79.4 kg, SpO2 98 %.        Intake/Output Summary (Last 24 hours) at 06/12/2019 1236 Last data filed at 06/12/2019 1120 Gross per 24 hour  Intake 50 ml  Output -  Net 50 ml   Filed Weights   06/12/19 1041  Weight: 79.4 kg    Examination: General: Acutely ill appearing female, some respiratory distress due to tongue size HENT: [place]/AT, PERRL, EOM-I and MMM.  Very enlarged tongue that is literally outside her mouth and she can not close her mouth Lungs: Diminished but clear bilaterally Cardiovascular: RRR, Nl S1/S2 and -M/R/G Abdomen: Soft, NT, ND and +BS Extremities: -edema and -tenderness Neuro: Alert and interactive, moving all ext to command Skin: intact  Resolved Hospital Problem list   N/A  Assessment & Plan:  73 year old female with ACE induced angioedema and respiratory failure.  Patient is to go to the OR for likely nasal intubation then transfer intubated to the ICU.  Discussed with ENT and anesthesia.  Angioedema:  - Solumedrol 60 q4  - Pepcid 20 IV BID  - Benadryl  - Add ACE to allergy list  VDRF:  - PRVC  - ABG and CXR  - Titrate O2 for sat of 88-92%  - ABG and CXR in AM  Sedation:  - Fentanyl drip  - Precedex drip  Consult nutrition for TF per nutrition Order admission labs SQ heparin  Labs  CBC: No results for input(s): WBC, NEUTROABS, HGB, HCT, MCV, PLT in the last 168 hours.  Basic Metabolic Panel: No results for input(s): NA, K, CL, CO2, GLUCOSE, BUN, CREATININE, CALCIUM, MG, PHOS in the last 168 hours. GFR: CrCl cannot be calculated (No successful lab value found.). No results for input(s): PROCALCITON, WBC, LATICACIDVEN in the last 168 hours.  Liver Function Tests: No results for input(s): AST, ALT, ALKPHOS, BILITOT, PROT, ALBUMIN in the last 168 hours. No results for input(s): LIPASE, AMYLASE in the last 168 hours. No results for input(s): AMMONIA in the last 168 hours.  ABG No  results found for: PHART, PCO2ART, PO2ART, HCO3, TCO2, ACIDBASEDEF, O2SAT   Coagulation Profile: No results for input(s): INR, PROTIME in the last 168 hours.  Cardiac Enzymes: No results for input(s): CKTOTAL, CKMB, CKMBINDEX, TROPONINI in the last 168 hours.  HbA1C: No results found for: HGBA1C  CBG: No results for input(s): GLUCAP in the last 168 hours.  Review of Systems:   Unattainable due to enlarged tongue and now sedation  Past Medical History  She,  has a past medical history of Diabetes mellitus without complication ([place]) and Hypertension.   Surgical History   Unknown   Social History      Family History   Her family history is not on file.   Allergies Allergies  Allergen Reactions  . Lisinopril Anaphylaxis  . Acetaminophen Hives    Causes wheels and hives     Home Medications  Prior to Admission medications   Medication Sig Start Date End Date Taking? Authorizing Provider  lisinopril (ZESTRIL) 10 MG tablet Take 10 mg by mouth daily.   Yes [provider]    The patient is critically ill with multiple organ systems failure and requires high complexity decision making for assessment and support, frequent evaluation and titration of therapies, application of advanced monitoring technologies and extensive interpretation of multiple databases.   Critical Care Time devoted to patient care services described in this note is  45  Minutes. This time reflects time of care of this signee Dr Jennet Maduro. This critical care time does not reflect procedure time, or teaching time or supervisory time of PA/NP/Med student/Med Resident etc but could involve care discussion time.  Rush Farmer, M.D. [facility]/Critical Care Medicine. Pager: [phone]. After hours pager: [phone].

## 2019-06-12 NOTE — Anesthesia Preprocedure Evaluation (Signed)
Anesthesia Evaluation  Patient identified by MRN, date of birth, ID band Patient awake    Reviewed: Allergy & Precautions, Patient's Chart, lab work & pertinent test results, Unable to perform ROS - Chart review onlyPreop documentation limited or incomplete due to emergent nature of procedure.  History of Anesthesia Complications Negative for: history of anesthetic complications  Airway Mallampati: IV   Neck ROM: Full   Comment:  Tongue protruding from mouth, edematous submental region  Dental  (+) Dental Advisory Given   Pulmonary    Pulmonary exam normal        Cardiovascular hypertension, Normal cardiovascular exam     Neuro/Psych    GI/Hepatic   Endo/Other  diabetes  Renal/GU      Musculoskeletal   Abdominal   Peds  Hematology   Anesthesia Other Findings Angioedema   Reproductive/Obstetrics                             Anesthesia Physical Anesthesia Plan  ASA: II and emergent  Anesthesia Plan: General   Post-op Pain Management:    Induction:   PONV Risk Score and Plan: 3 and Treatment may vary due to age or medical condition  Airway Management Planned: Nasal ETT, Fiberoptic Intubation Planned and Awake Intubation Planned  Additional Equipment: None  Intra-op Plan:   Post-operative Plan: Post-operative intubation/ventilation  Informed Consent: I have reviewed the patients History and Physical, chart, labs and discussed the procedure including the risks, benefits and alternatives for the proposed anesthesia with the patient or authorized representative who has indicated his/her understanding and acceptance.     Dental advisory given and Only emergency history available  Plan Discussed with: Surgeon, CRNA and Anesthesiologist  Anesthesia Plan Comments: (Awake nasal intubation planned in OR with ENT at bedside for possible tracheostomy. Discussed with patient, verbal  consent obtained )        Anesthesia Quick Evaluation

## 2019-06-12 NOTE — ED Notes (Signed)
Suction at bedside  Pt is a Therapist, sports and able to suction herself

## 2019-06-13 ENCOUNTER — Encounter (HOSPITAL_COMMUNITY): Payer: Self-pay | Admitting: Otolaryngology

## 2019-06-13 ENCOUNTER — Inpatient Hospital Stay (HOSPITAL_COMMUNITY): Payer: Medicare Other

## 2019-06-13 DIAGNOSIS — J9601 Acute respiratory failure with hypoxia: Secondary | ICD-10-CM

## 2019-06-13 DIAGNOSIS — T783XXA Angioneurotic edema, initial encounter: Principal | ICD-10-CM

## 2019-06-13 DIAGNOSIS — T464X5A Adverse effect of angiotensin-converting-enzyme inhibitors, initial encounter: Secondary | ICD-10-CM

## 2019-06-13 DIAGNOSIS — J988 Other specified respiratory disorders: Secondary | ICD-10-CM

## 2019-06-13 LAB — POCT I-STAT 7, (LYTES, BLD GAS, ICA,H+H)
Acid-Base Excess: 2 mmol/L (ref 0.0–2.0)
Bicarbonate: 24.2 mmol/L (ref 20.0–28.0)
Calcium, Ion: 1.15 mmol/L (ref 1.15–1.40)
HCT: 38 % (ref 36.0–46.0)
Hemoglobin: 12.9 g/dL (ref 12.0–15.0)
O2 Saturation: 100 %
Patient temperature: 98.6
Potassium: 3 mmol/L — ABNORMAL LOW (ref 3.5–5.1)
Sodium: 138 mmol/L (ref 135–145)
TCO2: 25 mmol/L (ref 22–32)
pCO2 arterial: 31.1 mmHg — ABNORMAL LOW (ref 32.0–48.0)
pH, Arterial: 7.499 — ABNORMAL HIGH (ref 7.350–7.450)
pO2, Arterial: 184 mmHg — ABNORMAL HIGH (ref 83.0–108.0)

## 2019-06-13 LAB — BASIC METABOLIC PANEL
Anion gap: 15 (ref 5–15)
BUN: 21 mg/dL (ref 8–23)
CO2: 23 mmol/L (ref 22–32)
Calcium: 8.6 mg/dL — ABNORMAL LOW (ref 8.9–10.3)
Chloride: 102 mmol/L (ref 98–111)
Creatinine, Ser: 0.81 mg/dL (ref 0.44–1.00)
GFR calc Af Amer: >60 mL/min (ref >60)
GFR calc non Af Amer: >60 mL/min (ref >60)
Glucose, Bld: 242 mg/dL — ABNORMAL HIGH (ref 70–99)
Potassium: 2.9 mmol/L — ABNORMAL LOW (ref 3.5–5.1)
Sodium: 140 mmol/L (ref 135–145)

## 2019-06-13 LAB — HEMOGLOBIN A1C
Hgb A1c MFr Bld: 12.5 % — ABNORMAL HIGH (ref 4.8–5.6)
Mean Plasma Glucose: 312.05 mg/dL

## 2019-06-13 LAB — CBC
HCT: 37.2 % (ref 36.0–46.0)
Hemoglobin: 13 g/dL (ref 12.0–15.0)
MCH: 29.7 pg (ref 26.0–34.0)
MCHC: 34.9 g/dL (ref 30.0–36.0)
MCV: 84.9 fL (ref 80.0–100.0)
Platelets: 219 10*3/uL (ref 150–400)
RBC: 4.38 MIL/uL (ref 3.87–5.11)
RDW: 12.1 % (ref 11.5–15.5)
WBC: 7 10*3/uL (ref 4.0–10.5)
nRBC: 0 % (ref 0.0–0.2)

## 2019-06-13 LAB — GLUCOSE, CAPILLARY
Glucose-Capillary: 225 mg/dL — ABNORMAL HIGH (ref 70–99)
Glucose-Capillary: 211 mg/dL — ABNORMAL HIGH (ref 70–99)
Glucose-Capillary: 248 mg/dL — ABNORMAL HIGH (ref 70–99)
Glucose-Capillary: 268 mg/dL — ABNORMAL HIGH (ref 70–99)
Glucose-Capillary: 242 mg/dL — ABNORMAL HIGH (ref 70–99)

## 2019-06-13 LAB — PHOSPHORUS: Phosphorus: 2.4 mg/dL — ABNORMAL LOW (ref 2.5–4.6)

## 2019-06-13 LAB — MAGNESIUM: Magnesium: 1.4 mg/dL — ABNORMAL LOW (ref 1.7–2.4)

## 2019-06-13 MED ORDER — INSULIN GLARGINE 100 UNIT/ML ~~LOC~~ SOLN
5.0000 [IU] | Freq: Every day | SUBCUTANEOUS | Status: DC
  Administered 2019-06-13 – 2019-06-14 (×2): 5 [IU] via SUBCUTANEOUS
  Filled 2019-06-13 (×2): qty 0.05

## 2019-06-13 MED ORDER — POTASSIUM PHOSPHATES 15 MMOLE/5ML IV SOLN: 40.0000 meq | Freq: Once | INTRAVENOUS | Status: DC

## 2019-06-13 MED ORDER — MAGNESIUM SULFATE 2 GM/50ML IV SOLN
2.0000 g | Freq: Once | INTRAVENOUS | Status: AC
  Administered 2019-06-13: 2 g via INTRAVENOUS
  Filled 2019-06-13: qty 50

## 2019-06-13 MED ORDER — INSULIN ASPART 100 UNIT/ML ~~LOC~~ SOLN
0.0000 [IU] | SUBCUTANEOUS | Status: DC
  Administered 2019-06-13: 13:00:00 3 [IU] via SUBCUTANEOUS
  Administered 2019-06-13 (×2): 5 [IU] via SUBCUTANEOUS
  Administered 2019-06-13: 16:00:00 8 [IU] via SUBCUTANEOUS
  Administered 2019-06-14: 04:00:00 3 [IU] via SUBCUTANEOUS
  Administered 2019-06-14: 10:00:00 5 [IU] via SUBCUTANEOUS

## 2019-06-13 MED ORDER — POTASSIUM PHOSPHATES 15 MMOLE/5ML IV SOLN
30.0000 mmol | Freq: Once | INTRAVENOUS | Status: AC
  Administered 2019-06-13: 30 mmol via INTRAVENOUS
  Filled 2019-06-13: qty 10

## 2019-06-13 MED ORDER — CHLORHEXIDINE GLUCONATE 0.12% ORAL RINSE (MEDLINE KIT)
15.0000 mL | Freq: Two times a day (BID) | OROMUCOSAL | Status: DC
  Administered 2019-06-13 – 2019-06-14 (×3): 15 mL via OROMUCOSAL

## 2019-06-13 MED ORDER — ORAL CARE MOUTH RINSE
15.0000 mL | Freq: Four times a day (QID) | OROMUCOSAL | Status: DC
  Administered 2019-06-13 – 2019-06-14 (×5): 15 mL via OROMUCOSAL

## 2019-06-13 NOTE — Procedures (Signed)
Extubation Procedure Note  Patient Details:   Name: Maria Santiago DOB: 05/31/1946 MRN: [id number]   Airway Documentation:    Vent end date: 06/13/19 Vent end time: 0915   Evaluation  O2 sats: stable throughout Complications: No apparent complications Patient did tolerate procedure well. Bilateral Breath Sounds: Clear, Diminished   Yes   RT extubated patient per MD order to 3L [place] with RN at bedside. Positive cuff leak noted. Patient tolerated well and can speak. No stridor noted. Patient says her breathing is comfortable. RT will continue to monitor as needed.    Vernona Rieger 06/13/2019, 9:23 AM

## 2019-06-13 NOTE — Progress Notes (Addendum)
NAME:  Maria Santiago, MRN:  [id number], DOB:  09/30/1945, LOS: 1 ADMISSION DATE:  06/12/2019, CONSULTATION DATE:  06/12/2019 REFERRING MD:  EDP - Dykstra, CHIEF COMPLAINT:  Acute respiratory failure due to ACE angioedema   Brief History   73 y/o female with history of HTN who is on lisinopril who woke up on the morning of presentation with tongue swelling.  Patient presented to [place] ER with swelling.  Tongue was enlarged enough to be beyond the lips and patient was unable to close her mouth.  Patient was transferred ER to ER where anesthesia, ENT and CCM were bedside.  Upon evaluating the patient and discussion amongst the group decision was made to take the patient to the ER with anesthesia performing and ENT bedside an endotracheal intubation then admission to the ICU for CCM to stabilize but attempt to avoid a tracheostomy at this time.  Past Medical History  HTN  Significant Hospital Events   10/4 Admit with acute respiratory failure from angioedema  Consults:  PCCM Anesthesia ENT  Procedures:  Nasal Intubation 10/4 >>   Significant Diagnostic Tests:     Micro Data:  COVID 10/4 >> negative  Antimicrobials:     Interim history/subjective:  Pt reports feeling much better, indicates tongue swelling much improved.  No acute events overnight. Sedation turned off. Patient weaning on PSV 5/5.   Objective   Blood pressure 98/82, pulse 81, temperature 99 F (37.2 C), temperature source Oral, resp. rate 20, height 5\' 6"  (1.676 m), weight 77.5 kg, SpO2 100 %.    Vent Mode: CPAP;PSV FiO2 (%):  [21 %-100 %] 30 % Set Rate:  [18 bmp] 18 bmp Vt Set:  [470 mL] 470 mL PEEP:  [5 cmH20] 5 cmH20 Pressure Support:  [5 cmH20] 5 cmH20 Plateau Pressure:  [15 cmH20-18 cmH20] 16 cmH20   Intake/Output Summary (Last 24 hours) at 06/13/2019 0839 Last data filed at 06/13/2019 0800 Gross per 24 hour  Intake 2135.06 ml  Output 250 ml  Net 1885.06 ml   Filed Weights   06/12/19  1041 06/13/19 0215  Weight: 79.4 kg 77.5 kg    Examination: General: elderly female lying in bed in NAD on vent   HEENT: MM pink/moist, nasal intubation Neuro: Awake, alert, interacts appropriately, writes messages  CV: s1s2 rrr, no m/r/g PULM:  Non-labored, lungs bilaterally clear, + loud cuff leak on exam  GI: soft, bsx4 active  Extremities: warm/dry, no edema  Skin: no rashes or lesions  Resolved Hospital Problem list      Assessment & Plan:   Acute Respiratory Failure secondary to Angioedema / Upper Airway Obstruction  -significantly improved 10/5, cuff leak on exam  P: Continue solumedrol 60 mg IV Q6, consider reduction 10/6 Pepcid 20 mg IV BID  Benadryl 25 mg Q6  No further ACE / ARB Proceed with extubation, emergency airway equipement to bedside pre-extubation   Hypertension  P: No further ACE / ARB  Continue Cardene gtt for BP control Will transition to oral agents once ok for PO's   DM  P: Add lantus 5 units  Adjust SSI to moderate scale  Labs    CBC: Recent Labs  Lab 06/12/19 1556 06/12/19 1558 06/13/19 0410 06/13/19 0502  WBC  --  6.1  --  7.0  NEUTROABS  --  5.4  --   --   HGB 13.6 14.3 12.9 13.0  HCT 40.0 42.9 38.0 37.2  MCV  --  88.3  --  84.9  PLT  --  PLATELET CLUMPS NOTED ON SMEAR, UNABLE TO ESTIMATE  --  [id number]    Basic Metabolic Panel: Recent Labs  Lab 06/12/19 1556 06/12/19 1558 06/13/19 0410 06/13/19 0502  NA 136 137 138 140  K 3.5 3.8 3.0* 2.9*  CL  --  100  --  102  CO2  --  22  --  23  GLUCOSE  --  296*  --  242*  BUN  --  15  --  21  CREATININE  --  0.65  --  0.81  CALCIUM  --  8.7*  --  8.6*  MG  --  1.4*  --  1.4*  PHOS  --  3.1  --  2.4*   GFR: Estimated Creatinine Clearance: 65 mL/min (by C-G formula based on SCr of 0.81 mg/dL). Recent Labs  Lab 06/12/19 1558 06/13/19 0502  WBC 6.1 7.0    Liver Function Tests: Recent Labs  Lab 06/12/19 1558  AST 18  ALT 14  ALKPHOS 73  BILITOT 1.2  PROT 6.5  ALBUMIN  3.5   Recent Labs  Lab 06/12/19 1558  LIPASE 24   No results for input(s): AMMONIA in the last 168 hours.  ABG    Component Value Date/Time   PHART 7.499 (H) 06/13/2019 0410   PCO2ART 31.1 (L) 06/13/2019 0410   PO2ART 184.0 (H) 06/13/2019 0410   HCO3 24.2 06/13/2019 0410   TCO2 25 06/13/2019 0410   O2SAT 100.0 06/13/2019 0410     Coagulation Profile: Recent Labs  Lab 06/12/19 1558  INR 1.1    Cardiac Enzymes: No results for input(s): CKTOTAL, CKMB, CKMBINDEX, TROPONINI in the last 168 hours.  HbA1C: No results found for: HGBA1C  CBG: Recent Labs  Lab 06/12/19 1457 06/12/19 1924 06/12/19 2344 06/13/19 0355 06/13/19 0726  GLUCAP 256* 255* 319* 225* 211*    CRITICAL CARE Performed by: Noe Gens   Total critical care time: 30 minutes  Critical care time was exclusive of separately billable procedures and treating other patients.  Critical care was necessary to treat or prevent imminent or life-threatening deterioration.  Critical care was time spent personally by me on the following activities: development of treatment plan with patient and/or surrogate as well as nursing, discussions with consultants, evaluation of patient's response to treatment, examination of patient, obtaining history from patient or surrogate, ordering and performing treatments and interventions, ordering and review of laboratory studies, ordering and review of radiographic studies, pulse oximetry and re-evaluation of patient's condition.   Noe Gens, NP-C [facility] Pgr: [phone] or if no answer [phone] 06/13/2019, 8:40 AM

## 2019-06-13 NOTE — Progress Notes (Signed)
Inpatient Diabetes Program Recommendations  AACE/ADA: New Consensus Statement on Inpatient Glycemic Control (2015)  Target Ranges:  Prepandial:   less than 140 mg/dL      Peak postprandial:   less than 180 mg/dL (1-2 hours)      Critically ill patients:  140 - 180 mg/dL   Lab Results  Component Value Date   GLUCAP 248 (H) 06/13/2019   HGBA1C 12.5 (H) 06/13/2019    Review of Glycemic Control Results for Maria Santiago, Maria Santiago (MRN [id number]) as of 06/13/2019 13:48  Ref. Range 06/12/2019 19:24 06/12/2019 23:44 06/13/2019 03:55 06/13/2019 07:26 06/13/2019 12:02  Glucose-Capillary Latest Ref Range: 70 - 99 mg/dL 255 (H) 319 (H) 225 (H) 211 (H) 248 (H)   Diabetes history: DM 2 Outpatient Diabetes medications:  Lantus 14 units q HS Current orders for Inpatient glycemic control: Novolog moderate q 4 hours Lantus 5 units daily Solumedrol 60 mg IV q 6 hours  Inpatient Diabetes Program Recommendations:    Please consider increasing Lantus to 15 units daily. It appears that patient was newly started on insulin prior to admit on 06/06/19.  Likely will need insulin increased while in the hospital.  Will follow up on 06/14/19 regarding A1C.   Thanks,  Adah Perl, RN, BC-ADM Inpatient Diabetes Coordinator Pager [phone] (8a-5p)

## 2019-06-14 ENCOUNTER — Encounter (HOSPITAL_COMMUNITY): Payer: Self-pay | Admitting: Pulmonary Disease

## 2019-06-14 ENCOUNTER — Telehealth: Payer: Self-pay | Admitting: Nurse Practitioner

## 2019-06-14 LAB — BASIC METABOLIC PANEL
Anion gap: 11 (ref 5–15)
BUN: 22 mg/dL (ref 8–23)
CO2: 24 mmol/L (ref 22–32)
Calcium: 8.4 mg/dL — ABNORMAL LOW (ref 8.9–10.3)
Chloride: 103 mmol/L (ref 98–111)
Creatinine, Ser: 0.72 mg/dL (ref 0.44–1.00)
GFR calc Af Amer: >60 mL/min (ref >60)
GFR calc non Af Amer: >60 mL/min (ref >60)
Glucose, Bld: 177 mg/dL — ABNORMAL HIGH (ref 70–99)
Potassium: 3.7 mmol/L (ref 3.5–5.1)
Sodium: 138 mmol/L (ref 135–145)

## 2019-06-14 LAB — GLUCOSE, CAPILLARY
Glucose-Capillary: 154 mg/dL — ABNORMAL HIGH (ref 70–99)
Glucose-Capillary: 209 mg/dL — ABNORMAL HIGH (ref 70–99)
Glucose-Capillary: 211 mg/dL — ABNORMAL HIGH (ref 70–99)

## 2019-06-14 LAB — PHOSPHORUS: Phosphorus: 3.4 mg/dL (ref 2.5–4.6)

## 2019-06-14 LAB — CBC
HCT: 36.4 % (ref 36.0–46.0)
Hemoglobin: 12.4 g/dL (ref 12.0–15.0)
MCH: 29.6 pg (ref 26.0–34.0)
MCHC: 34.1 g/dL (ref 30.0–36.0)
MCV: 86.9 fL (ref 80.0–100.0)
Platelets: 222 10*3/uL (ref 150–400)
RBC: 4.19 MIL/uL (ref 3.87–5.11)
RDW: 12.6 % (ref 11.5–15.5)
WBC: 10.1 10*3/uL (ref 4.0–10.5)
nRBC: 0 % (ref 0.0–0.2)

## 2019-06-14 LAB — MAGNESIUM: Magnesium: 2 mg/dL (ref 1.7–2.4)

## 2019-06-14 MED ORDER — PREDNISONE 10 MG PO TABS: ORAL_TABLET | ORAL | 0 refills | Status: AC

## 2019-06-14 MED ORDER — FAMOTIDINE 20 MG PO TABS
20.0000 mg | ORAL_TABLET | Freq: Two times a day (BID) | ORAL | Status: DC
  Administered 2019-06-14: 20 mg via ORAL
  Filled 2019-06-14: qty 1

## 2019-06-14 MED ORDER — FAMOTIDINE 20 MG PO TABS: 20.0000 mg | ORAL_TABLET | Freq: Every day | ORAL | Status: DC

## 2019-06-14 MED ORDER — HYDROCHLOROTHIAZIDE 25 MG PO TABS
25.0000 mg | ORAL_TABLET | Freq: Every day | ORAL | Status: DC
  Administered 2019-06-14: 25 mg via ORAL
  Filled 2019-06-14: qty 1

## 2019-06-14 NOTE — Telephone Encounter (Signed)
Called and spoke to pharmacy tech. The pred taper sent in was only requiring #15 tabs, however #30 tabs were sent in. The tech stated they already filled the medication for the #15 tabs. Nothing further needed at this time.

## 2019-06-14 NOTE — Progress Notes (Signed)
Removed patient's IV in RFA. Patient dressed with assistance from daughter. I discussed discharge paperwork with daughter and patient together. All questions answered and denies any further needs. She ambulated very well out of unit with daughter

## 2019-06-14 NOTE — Discharge Summary (Addendum)
Physician Discharge Summary         Patient ID: Maria Santiago MRN: [id number] DOB/AGE: 73-19-1947 73 y.o.  Admit date: 06/12/2019 Discharge date: 06/14/2019  Discharge Diagnoses:    ACEi induced Angioedema Acute respiratory insufficiency Hypertension Diabetes Type 2 / hyperglycemia  Discharge summary    73 year old female with history of HTN, HLD, hand tremors, breast cancer (2018) and diabetes type II who is on lisinopril who woke up on the morning of presentation with tongue swelling.  Patient presented to [facility] ER with swelling on 10/4.  Tongue was enlarged enough to be beyond the lips and patient was unable to close her mouth.  Patient was transferred ER to ER where anesthesia, ENT and CCM were bedside.  Upon evaluating the patient and discussion amongst the group decision was made to take the patient to the OR with anesthesia performing intubation and ENT bedside for backup endotracheal intubation or possible emergency tracheostomy.  She was successful nasally intubated by anesthesia and then transferred to ICU.  She was treated with IV solumedrol, pepcid, and benadryl.  Her hyperglycemia was controlled with sliding scale insulin and lantus.  Initial hypertension treated with cardene IV which was stopped early on 10/5.  Patient was able to be successfully extubated on 10/5 after angioedema subsided.  Unclear cause of angioedema, possibly related to ACEi however patient also states she recently started zetia after her annual visit on 06/06/2019.  Patient has remained hemodynamically stable, airway stable, and able to tolerate diet since extubation.  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  She will be discharged home and is to follow-up with her primary care physician, Dr. Vertell Limber next week (followed at [facility]).  ACEi and zetia have been added to her allergies and acknowledges to avoid ARBs as well.   Discharge Plan by Active  Problems    ACEi induced Angioedema and respiratory insufficiency  - no residual dysphagia/ stridor/ swelling P:  No ACEi/ ARB ever No Zetia  Continue steroid taper over 5 days Acknowledges to return to ER immediately if any changes  HTN P:  Restarted on home HCTZ 25 mg daily Continue pre-admit ASA 81mg  and KCL To f/u with primary MD in 1 week Encouraged to monitor BP at home, daily weights and low sodium diet   DMT2 uncontrolled - A1C 12.5 P:  Continue home metformin Resume home lantus, further recommendations per primary care Patient will monitor her CBGs at home  HLD P:  No Zetia, PCP to determine agent  Significant Hospital tests/ studies   Procedures   10/4 Nasal intubation >> 10/5  Culture data/antimicrobials   COVID 10/4 >> negative   Consults  Anesthesia ENT   Discharge Exam: BP (!) 151/75 (BP Location: Right Arm)   Pulse 60   Temp 98.3 F (36.8 C) (Oral)   Resp 12   Ht 5\' 6"  (1.676 m)   Wt 77.5 kg   SpO2 98%   BMI 27.58 kg/m   General:  Pleasant elderly female sitting upright in bed in NAD HEENT: MM pink/moist, no oral swelling or stridor appreciated Neuro: Alert and oriented, non focal, MAE CV: s1s2 , no m/r/g PULM:  CTA, non labored, room air GI: soft, bsx4 active  Extremities: warm/dry, no edema  Skin: no rashes or lesions  Labs at discharge   Lab Results  Component Value Date   CREATININE 0.72 06/14/2019   BUN 22 06/14/2019  NA 138 06/14/2019   K 3.7 06/14/2019   CL 103 06/14/2019   CO2 24 06/14/2019   Lab Results  Component Value Date   WBC 10.1 06/14/2019   HGB 12.4 06/14/2019   HCT 36.4 06/14/2019   MCV 86.9 06/14/2019   PLT 222 06/14/2019   Lab Results  Component Value Date   ALT 14 06/12/2019   AST 18 06/12/2019   ALKPHOS 73 06/12/2019   BILITOT 1.2 06/12/2019   Lab Results  Component Value Date   INR 1.1 06/12/2019    Current radiological studies    Dg Chest Port 1 View  Result Date:  06/13/2019 CLINICAL DATA:  Endotracheal tube placement EXAM: PORTABLE CHEST 1 VIEW COMPARISON:  06/12/2019 FINDINGS: The endotracheal tube terminates approximately 4.4 cm above the carina. The heart size is stable. The previously noted airspace opacity in the left lower/left mid lung zone has significantly improved from prior study. The aortic calcifications are noted. There is no pneumothorax. No large pleural effusion. IMPRESSION: 1. Lines and tubes as above. 2. Interval improvement in the previously demonstrated atelectasis in the left mid lung zone. Electronically Signed   By: Constance Holster M.D.   On: 06/13/2019 07:57   Dg Chest Port 1 View  Result Date: 06/12/2019 CLINICAL DATA:  73 year old female with history of respiratory failure. EXAM: PORTABLE CHEST 1 VIEW COMPARISON:  No priors. FINDINGS: An endotracheal tube is in place with tip 5.6 cm above the carina. Lung volumes are low. Linear opacity in the left mid lung, likely subsegmental atelectasis. No acute consolidative airspace disease. No pleural effusions. No evidence of pulmonary edema. Heart size is normal. The patient is rotated to the right on today's exam, resulting in distortion of the mediastinal contours and reduced diagnostic sensitivity and specificity for mediastinal pathology. Atherosclerosis in the thoracic aorta. IMPRESSION: 1. Support apparatus, as above. 2. Low lung volumes with subsegmental atelectasis or scarring in the left mid lung. 3. Aortic atherosclerosis. Electronically Signed   By: Vinnie Langton M.D.   On: 06/12/2019 15:21    Disposition:    Discharge disposition: 01-Home or Self Care        Allergies as of 06/14/2019      Reactions   Acetaminophen Hives, Swelling, Other (See Comments)   Lisinopril Anaphylaxis   Eggs Or Egg-derived Products Other (See Comments)   10.5.2020 Patient reports that she can tolerate the flu vaccine   Zetia [ezetimibe]    Possible cause of angioedema       Medication  List    STOP taking these medications   diphenhydrAMINE 25 mg capsule Commonly known as: BENADRYL   ezetimibe 10 MG tablet Commonly known as: ZETIA   lisinopril 20 MG tablet Commonly known as: ZESTRIL     TAKE these medications   aspirin EC 81 MG tablet Take 81 mg by mouth daily.   hydrochlorothiazide 25 MG tablet Commonly known as: HYDRODIURIL Take 25 mg by mouth daily.   Lantus SoloStar 100 UNIT/ML Solostar Pen Generic drug: Insulin Glargine Inject 14 Units into the skin at bedtime.   potassium chloride SA 20 MEQ tablet Commonly known as: KLOR-CON Take 20 mEq by mouth 2 (two) times daily.   predniSONE 10 MG tablet Commonly known as: DELTASONE Take 5 tablets (50 mg total) by mouth daily for 1 day, THEN 4 tablets (40 mg total) daily for 1 day, THEN 3 tablets (30 mg total) daily for 1 day, THEN 2 tablets (20 mg total) daily for 1 day, THEN  1 tablet (10 mg total) daily for 1 day. Start taking on: June 15, 2019   propranolol ER 80 MG 24 hr capsule Commonly known as: INDERAL LA Take 80 mg by mouth daily as needed (TREMORS).     continue home metformin   Follow-up appointment   Dr. Jennette Dubin at [facility] 06/22/2019 at 10 am [phone]  Discharge Condition:    stable      Kennieth Rad, MSN, AGACNP-BC [place] Pgr: [phone] or if no answer [phone] 06/14/2019, 10:29 AM

## 2019-06-14 NOTE — Progress Notes (Addendum)
NAME:  Maria Santiago, MRN:  [id number], DOB:  1946-02-27, LOS: 2 ADMISSION DATE:  06/12/2019, CONSULTATION DATE:  06/12/2019 REFERRING MD:  EDP - Dykstra, CHIEF COMPLAINT:  Acute respiratory failure due to ACE angioedema   Brief History   73 yo female with hx of HTN on lisinopril had dental work on 06/10/19.  Also started on zetia 1 day prior to symptom onset.  Developed tongue swelling and difficulty breathing on 06/11/19 that progressed on 06/12/19.  Presented to [facility] ER.  Transferred to [facility] ER and had nasal intubation.  Started on solumedrol, pepcid, and benadryl.  Past Medical History  HTN, HLD, DM, Tremor, Rt breast cancer, Renal cyst, Varicella  Significant Hospital Events   10/4 Admit with acute respiratory failure from angioedema 10/5 Extubated 10/6 D/c home  Consults:  Anesthesia ENT  Procedures:  Nasal Intubation 10/4 >> 10/5  Significant Diagnostic Tests:     Micro Data:  COVID 10/4 >> negative  Antimicrobials:     Interim history/subjective:  No problems swallowing.  Speech improved.  Walked in room w/o difficulty.  Passing urine w/o difficulty.  Denies chest pain, dyspnea, abdominal pain.  Objective   Blood pressure (!) 151/75, pulse 78, temperature 98.3 F (36.8 C), temperature source Oral, resp. rate 16, height 5\' 6"  (1.676 m), weight 77.5 kg, SpO2 97 %.        Intake/Output Summary (Last 24 hours) at 06/14/2019 0931 Last data filed at 06/14/2019 0300 Gross per 24 hour  Intake 1748.97 ml  Output 525 ml  Net 1223.97 ml   Filed Weights   06/12/19 1041 06/13/19 0215  Weight: 79.4 kg 77.5 kg    Examination:  General - alert Eyes - pupils reactive ENT - no sinus tenderness, no stridor Cardiac - regular rate/rhythm, no murmur Chest - equal breath sounds b/l, no wheezing or rales Abdomen - soft, non tender, + bowel sounds Extremities - no cyanosis, clubbing, or edema Skin - no rashes Neuro - normal strength, moves extremities, follows  commands Psych - normal mood and behavior   Assessment & Plan:   ACE inhibitor induced angioedema with tongue swelling. - wean off prednisone over next few days - benadryl d/c'ed due to mild delirium on 10/5 - continue pepcid for now  Hypertension. - continue HCTZ - f/u with PCP (Dr. Jodelle Gross with [facility]) within one week to further adjust BP regimen - suspect BP will improve some once she is off steroids  DM type II with steroid induced hyperglycemia. - continue lantus - resume metformin  HLD. - would not resume zetia  Labs    CMP Latest Ref Rng & Units 06/14/2019 06/13/2019 06/13/2019  Glucose 70 - 99 mg/dL 177(H) 242(H) -  BUN 8 - 23 mg/dL 22 21 -  Creatinine 0.44 - 1.00 mg/dL 0.72 0.81 -  Sodium 135 - 145 mmol/L 138 140 138  Potassium 3.5 - 5.1 mmol/L 3.7 2.9(L) 3.0(L)  Chloride 98 - 111 mmol/L 103 102 -  CO2 22 - 32 mmol/L 24 23 -  Calcium 8.9 - 10.3 mg/dL 8.4(L) 8.6(L) -  Total Protein 6.5 - 8.1 g/dL - - -  Total Bilirubin 0.3 - 1.2 mg/dL - - -  Alkaline Phos 38 - 126 U/L - - -  AST 15 - 41 U/L - - -  ALT 0 - 44 U/L - - -    CBC Latest Ref Rng & Units 06/14/2019 06/13/2019 06/13/2019  WBC 4.0 - 10.5 K/uL 10.1 7.0 -  Hemoglobin 12.0 - 15.0 g/dL 12.4 13.0 12.9  Hematocrit 36.0 - 46.0 % 36.4 37.2 38.0  Platelets 150 - 400 K/uL 222 219 -    ABG    Component Value Date/Time   PHART 7.499 (H) 06/13/2019 0410   PCO2ART 31.1 (L) 06/13/2019 0410   PO2ART 184.0 (H) 06/13/2019 0410   HCO3 24.2 06/13/2019 0410   TCO2 25 06/13/2019 0410   O2SAT 100.0 06/13/2019 0410    CBG (last 3)  Recent Labs    06/13/19 2352 06/14/19 0405 06/14/19 [place], MD [facility] 06/14/2019, 9:43 AM

## 2019-06-27 DIAGNOSIS — H26493 Other secondary cataract, bilateral: Secondary | ICD-10-CM | POA: Insufficient documentation

## 2019-06-27 DIAGNOSIS — Z961 Presence of intraocular lens: Secondary | ICD-10-CM

## 2019-06-27 DIAGNOSIS — H02834 Dermatochalasis of left upper eyelid: Secondary | ICD-10-CM

## 2019-06-27 DIAGNOSIS — H524 Presbyopia: Secondary | ICD-10-CM | POA: Insufficient documentation

## 2019-06-27 DIAGNOSIS — H02831 Dermatochalasis of right upper eyelid: Secondary | ICD-10-CM

## 2019-06-27 DIAGNOSIS — H43813 Vitreous degeneration, bilateral: Secondary | ICD-10-CM | POA: Insufficient documentation

## 2019-06-27 HISTORY — DX: Presbyopia: H52.4

## 2019-06-27 HISTORY — DX: Presence of intraocular lens: Z96.1

## 2019-06-27 HISTORY — DX: Other secondary cataract, bilateral: H26.493

## 2019-06-27 HISTORY — DX: Dermatochalasis of right upper eyelid: H02.831

## 2019-06-27 HISTORY — DX: Dermatochalasis of left upper eyelid: H02.834

## 2019-06-27 HISTORY — DX: Vitreous degeneration, bilateral: H43.813

## 2020-02-07 ENCOUNTER — Inpatient Hospital Stay: Admit: 2020-02-07 | Discharge: 2020-02-07 | Disposition: A | Discharge status: Home / Self Care | Payer: Self-pay

## 2020-02-09 DIAGNOSIS — H6123 Impacted cerumen, bilateral: Secondary | ICD-10-CM

## 2020-02-09 DIAGNOSIS — J383 Other diseases of vocal cords: Secondary | ICD-10-CM | POA: Insufficient documentation

## 2020-02-09 HISTORY — DX: Other diseases of vocal cords: J38.3

## 2020-02-09 HISTORY — DX: Impacted cerumen, bilateral: H61.23

## 2020-08-13 IMAGING — DX DG CHEST 1V PORT
1 series · 1 of 1 positions shown · non-contrast
Comparison: No priors.

CLINICAL DATA: 73-year-old female with history of respiratory
failure.

EXAM:
PORTABLE CHEST 1 VIEW

[chest ap]
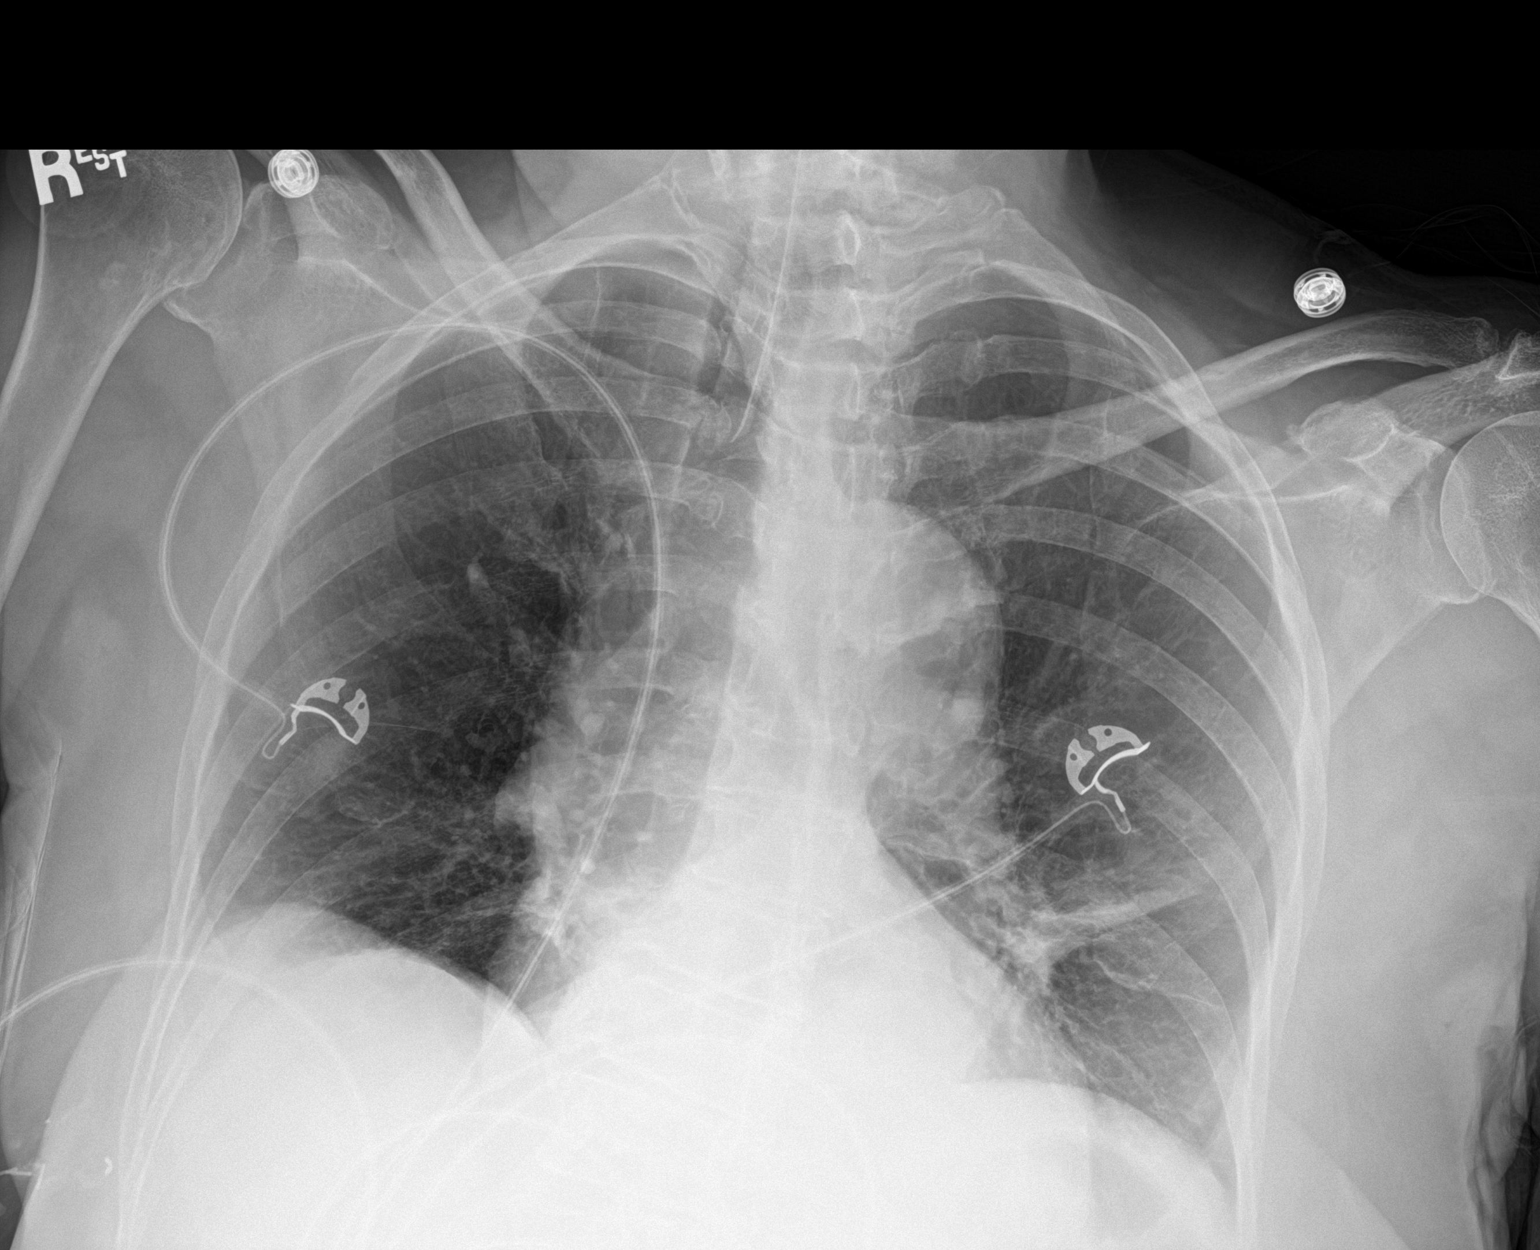

[1 of 1 positions shown; findings below may reference images not displayed]

FINDINGS: An endotracheal tube is in place with tip 5.6 cm above the carina.
Lung volumes are low. Linear opacity in the left mid lung, likely
subsegmental atelectasis. No acute consolidative airspace disease.
No pleural effusions. No evidence of pulmonary edema. Heart size is
normal. The patient is rotated to the right on today's exam,
resulting in distortion of the mediastinal contours and reduced
diagnostic sensitivity and specificity for mediastinal pathology.
Atherosclerosis in the thoracic aorta.
IMPRESSION: 1. Support apparatus, as above.
2. Low lung volumes with subsegmental atelectasis or scarring in the
left mid lung.
3. Aortic atherosclerosis.

## 2020-08-14 IMAGING — DX DG CHEST 1V PORT
1 series · 1 of 1 positions shown · non-contrast
Comparison: 06/12/2019

CLINICAL DATA: Endotracheal tube placement

EXAM:
PORTABLE CHEST 1 VIEW

[chest]
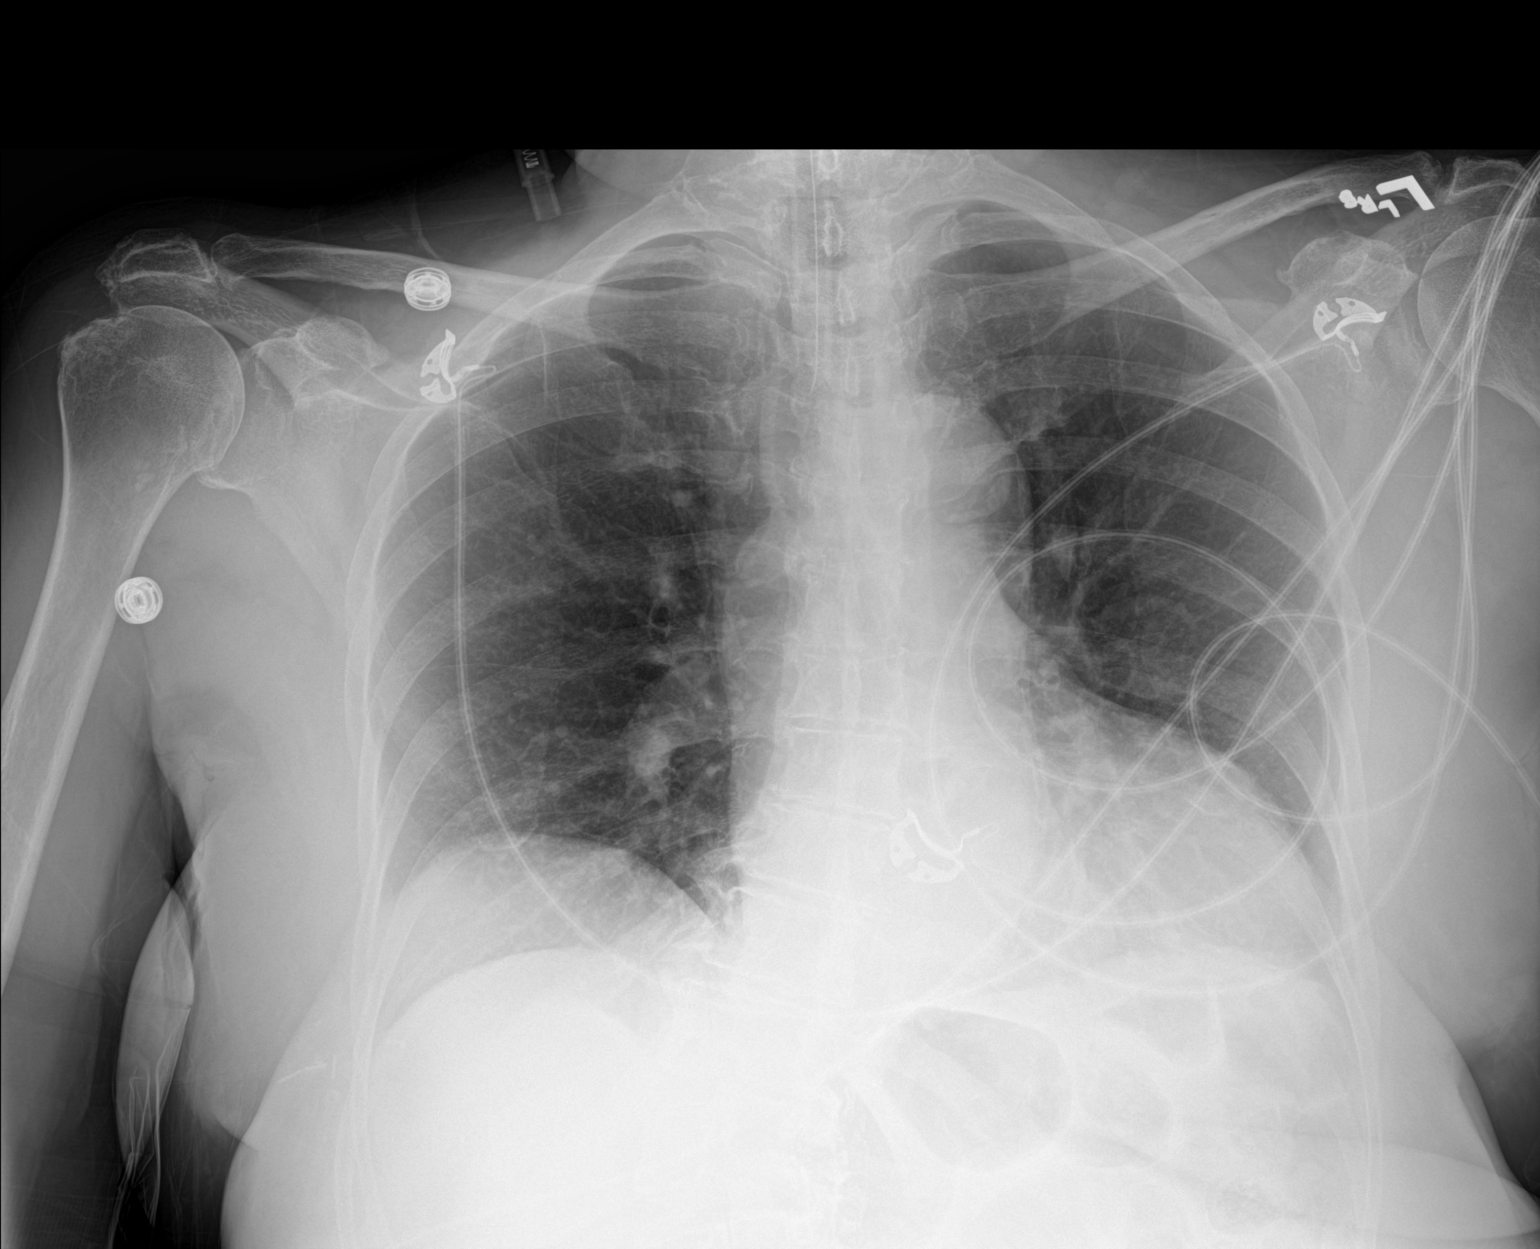

[1 of 1 positions shown; findings below may reference images not displayed]

FINDINGS: The endotracheal tube terminates approximately 4.4 cm above the
carina. The heart size is stable. The previously noted airspace
opacity in the left lower/left mid lung zone has significantly
improved from prior study. The aortic calcifications are noted.
There is no pneumothorax. No large pleural effusion.
IMPRESSION: 1. Lines and tubes as above.
2. Interval improvement in the previously demonstrated atelectasis
in the left mid lung zone.

## 2021-04-09 ENCOUNTER — Encounter: Payer: Self-pay | Admitting: Gastroenterology

## 2021-04-10 ENCOUNTER — Telehealth: Payer: Self-pay

## 2021-04-10 NOTE — Telephone Encounter (Signed)
Received Referral.  Placed on the referral coordinator desk.

## 2021-04-16 ENCOUNTER — Inpatient Hospital Stay: Admit: 2021-04-16 | Discharge: 2021-04-16 | Disposition: A | Discharge status: Home / Self Care | Payer: Self-pay

## 2021-09-20 ENCOUNTER — Encounter: Payer: Self-pay | Admitting: Gastroenterology

## 2021-09-25 ENCOUNTER — Telehealth: Payer: Self-pay

## 2021-09-25 NOTE — Telephone Encounter (Signed)
Ref by:  Prichard  [phone]    DM    Please review and advise

## 2021-09-26 NOTE — Telephone Encounter (Signed)
NA

## 2021-09-30 NOTE — Telephone Encounter (Signed)
Attempt 1 of 2: No answer, unable to leave VM D/t mailbox not picking up

## 2021-10-07 ENCOUNTER — Telehealth: Payer: Self-pay | Admitting: Endocrinology

## 2021-10-07 NOTE — Telephone Encounter (Signed)
Patients daughter called office to schedule new patient visit       Patient is scheduled for: 12/26/21   Provider scheduled with: Dr Barnet Pall   Appointment type: IN PERSON   Diagnosis from referral: DM      Patient will need new patient paperwork sent to address on file.      Any questions please call patient [phone]

## 2021-10-18 ENCOUNTER — Other Ambulatory Visit: Payer: Self-pay

## 2021-10-18 ENCOUNTER — Ambulatory Visit: Payer: Medicare (Managed Care) | Attending: Neurology | Admitting: Neurology

## 2021-10-18 ENCOUNTER — Encounter: Payer: Self-pay | Admitting: Gastroenterology

## 2021-10-18 ENCOUNTER — Encounter: Payer: Self-pay | Admitting: Neurology

## 2021-10-18 VITALS — BP 146/82 | HR 77 | Temp 97.0°F | Wt 150.8 lb

## 2021-10-18 DIAGNOSIS — G250 Essential tremor: Secondary | ICD-10-CM | POA: Insufficient documentation

## 2021-10-18 MED ORDER — PRIMIDONE 50 MG PO TABS *I*
25.0000 mg | ORAL_TABLET | Freq: Every evening | ORAL | 3 refills | Status: DC
Start: 2021-10-18 — End: 2022-09-09

## 2021-10-18 NOTE — Progress Notes (Signed)
[facility] Patient Note    Dear Dr. Vertell Limber, [place] Chard, MD,    We recently had the opportunity to see [name] at the [facility] for a new patient evaluation of tremor.      As you know, this is a 76 y.o. right-handed woman with a past medical history notable for essential tremor, HTN, DM2, and kidney stones.  She is unaccompanied.    HPI:   Tawan reports that started having tremor around 2005. Tremor was first noted on her right hand, it was initially minor. A couple of years after, she developed voice tremor. Now, she is also having left hand tremor, which she started to notice in the past year ago. She also endorses a little bit of tremor on her head. Denies leg, jaw, or chin tremor. Over time, her tremor has progressively worsened and has started interfering with daily activities. 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 In the past year, she has noticed further worsening of her hand tremor as well. Handwriting has become very bad, and it takes her a very long time to write something. She needs to use both hands when holding a cup or glass with liquids to avoid spilling. Cooking and eating are okay. No trouble with grooming. No problems using keys. She reports that hand tremor is the most bothersome.     She denies slowness or stiffness, other than mild symptoms that she attributes to age. Denies balance problems, no falls. Sense of smell is normal. Denies constipation, RBD symptoms, or orthostasis. Sometimes, she has trouble finding words; no other cognitive complaints. Denies hallucinations.     She is going to the gym twice per week.     She lives in [place], but she has been staying in [place] recently to help take care of her granddaughter. Previously, she used to see a neurologist in [place]. She was started on propranolol which provided  modest benefit, but she did not tolerated, felt lightheaded and foggy. She has not tried any other meds. She was mentioned in the past about the possibility of botulinum toxin injection for her voice tremor, but she is not interested at this time.     Current tremor meds:  - none    Prior meds tried:  - propranolol LA up to 120 mg daily - made her lightheaded and felt foggy    ROS: pertinent positive and negative symptoms as per HPI.    PMH/PSH:   - HTN  - DM2  - breast cancer s/p lumpectomy and chemoradiation    Social History:    - Lives with daughter and Curator,   - Worked as a Physiological scientist, several places; she stopped working in 2015  - EtOH use: occasionally, no changes on tremor  - Tobacco use: no  - Marijuana use: no  - Illicit drug use: no    Family History:  - dad had tremor, handwriting deteriorated very badly  - no other relatives with movement disorders    Medications:   Current Outpatient Medications   Medication Sig    hydroCHLOROthiazide (HYDRODIURIL) 25 mg tablet Take 25 mg by mouth daily    OZEMPIC, 0.25 OR 0.5 MG/DOSE, pen SMARTSIG:0.4 Milliliter(s) SUB-Q Once a Week    amLODIPine (NORVASC) 5 mg tablet Take 5 mg by mouth daily    metFORMIN (GLUCOPHAGE) 500  mg tablet Take 500 mg by mouth 2 times daily (with meals)    aspirin 81 mg EC tablet Take 81 mg by mouth daily    potassium chloride (KLOR-CON) 20 MEQ packet Take 20 mEq by mouth 2 times daily     Allergies:  Allergies   Allergen Reactions    Lisinopril Other (See Comments)     angioedema    Acetaminophen Hives      History:  Besides the above, I reviewed and updated as appropriate the problem list, past medical/surgical history, medications, allergies, social history, and family history.  These elements are documented elsewhere in the electronic health record.       EXAMINATION:  Vitals:    10/18/21 0805   BP: 146/82   BP Location: Left arm   Patient Position: Sitting   Cuff Size: adult   Pulse: 77   Temp: 36.1 C (97  F)   TempSrc: Temporal   Weight: 68.4 kg (150 lb 12.8 oz)      General: NAD  Chest: Normal WOB.  Extremities:  No deformities.     Neurological:  Mental Status: Awake and alert. Oriented to person, place and time.  Fluent language. Answering questions appropriately. Comprehension intact.  Attention intact.  Appropriate fund of knowledge.  Affect is appropriate for situation.    CN:   II: Visual fields full with no deficits.  III, IV, VI:  Normal conjugate gaze in all directions. No horizontal or vertical nystagmus. Normal pursuits and saccades.     V:  Facial sensation intact and symmetric.  VII:  No facial droop, facial musculature strong. There is no hypomimia.  VIII:  Hearing intact to voice  IX, X:  Palate elevates symmetrically. No hypophonia. No dysarthria or dysphonia. Moderate vocal tremor on casual conversation. On sustained phonation, there is gradual decrease of vocal tone with arrest after a couple of seconds.  XI:  Shoulder shrug strong and symmetric. Slight, intermittent, infrequent head tremor, primarily horizontal. No cervical dystonia.   XII:  Tongue midline.    Motor:   Muscle bulk: normal in all extremities.  Strength: full 5/5 with shoulder abduction, elbow flexion, hand grip, hip flexion, and ankle dorsiflexion bilaterally.   Tone: normal in the upper and lower extremities, no rigidity.   Kinesis: finger taps and hand open/close are mildly irregular with intrusion of tremor. Arm pronation/supination is normal bilaterally. Heel taps are slightly slow bilaterally, amplitude is normal. Toe taps are normal bilaterally.   Abnormal movements: there is slight rest tremor with distraction only, right>left hand. Slight-mild postural tremor in both hands (MCP flexion/extension), right>left. Mild intention on right hand, slight intention tremor on left hand. Slight-mild kinetic tremor; spiral drawing is slightly impaired bilaterally. No dystonic postures in the limbs.     Reflexes:  2+ DTR bilaterally  throughout, toes down-going.     Sensory: Light touch sensation is intact and symmetric in the limbs.     Coordination: No dysmetria on finger-to-nose bilaterally.    Gait: Able to rise from a chair with arms crossed without difficulty.  Casual gait is stable with normal base, stride length, and arm swing. Takes 3 steps backwards with pull test.  Posture is upright.      IMPRESSION:  - Essential tremor    Kandice Claflin is a 76 y.o. right-handed woman with a history notable for essential tremor, nephrolithiasis, HTN, and DM2, who presents for evaluation of tremor. She started having tremor more than 15 years ago, which has progressively  worsened over time, affecting predominantly her hands and voice. On examination today, she has moderate vocal tremor with a component of spasmodic dysphonia, slight head tremor, and mild action>rest tremor in the hands.    Her tremor is most consistent with her previous diagnosis of essential tremor (ET) given family history, several years of progression, and action component. In addition, her vocal tremor has characteristics of laryngeal dystonia, which sometimes can present along with ET. She also has a slight rest component to her hands tremor, which can develop over time in patients with long-standing ET; otherwise, there are no signs of parkinsonism, cerebellar dysfunction, or other neurological findings to suggest an alternative diagnosis.     As her hand tremor is bothersome and interferes with some of her daily activities, we discussed symptomatic therapy with primidone. We will start at low doses and up titrate as needed and tolerated. Side effect profile was discussed with the patient and let us know if she has any problems. If needed, gabapentin could be another option in the future. She did not tolerate propranolol, so we will avoid this medication; and she has history of nephrolithiasis, which limits the use of topiramate.     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       PLAN:  - Start primidone 25 mg nightly; will up titrate as needed and tolerated    Follow up: Follow up with Dr. Sela Hua in 2-3 months.  Elodia Rouser is to contact the clinic sooner should any new issues arise in the interim.    Thank you for the opportunity to provide care for your patient.  The patient was seen and discussed with attending Dr. Sela Hua.    Vicie Mutters, MD  Movement Disorders Fellow

## 2021-10-18 NOTE — Patient Instructions (Addendum)
Please reach out to the clinic with any additional questions or concerns. We encourage you to sign up and use MyChart for any non-urgent medical questions as MyChart is the preferred method of communication.  Please contact your pharmacy for medication refills and allow up to 2 business days for routine prescription refills.  For urgent messages, please call the clinic at [phone].  The clinic is open from 8am to 4:30pm Monday through Friday.    You can get [facility] appointment reminders by text - please make sure we have your cell phone number on file at check in/check-out.  Then you can just text [facility] to [phone] to receive appointment messages.       For your tremor, we recommend:  Start taking primidone 25 mg (half tablet of 50 mg) at nighttime.     We have sent you an email to active MyChart.

## 2021-10-20 DIAGNOSIS — G250 Essential tremor: Secondary | ICD-10-CM

## 2021-10-20 HISTORY — DX: Essential tremor: G25.0

## 2021-10-25 ENCOUNTER — Encounter: Payer: Self-pay | Admitting: Podiatry

## 2021-10-25 ENCOUNTER — Other Ambulatory Visit: Payer: Self-pay

## 2021-10-25 ENCOUNTER — Ambulatory Visit: Payer: Medicare Other | Admitting: Podiatry

## 2021-10-25 DIAGNOSIS — M2041 Other hammer toe(s) (acquired), right foot: Secondary | ICD-10-CM

## 2021-10-25 DIAGNOSIS — L84 Corns and callosities: Secondary | ICD-10-CM | POA: Diagnosis not present

## 2021-10-25 NOTE — Progress Notes (Signed)
°  Subjective:  Patient ID: Maria Santiago, female    DOB: 10/01/1945,   MRN: [id number]  Chief Complaint  Patient presents with   Callouses    Right foot second toe corn - sore to touch , painful to wear certain shoes    76 y.o. female presents for concern of right second toe corn that is very tender to touch. Has tried different corn removers and filing down the area but still painful. Would like to avoid surgery.  . Denies any other pedal complaints. Denies n/v/f/c.   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 CFT <3 seconds. Normal hair growth on digits. No edema.  Skin. No lacerations or abrasions bilateral feet. Hyperkeratotic lesion noted to dorsal second right digit.  Musculoskeletal: MMT 5/5 bilateral lower extremities in DF, PF, Inversion and Eversion. Deceased ROM in DF of ankle joint. Hammered second digit on right. Rigid.  Neurological: Sensation intact to light touch.   Assessment:   1. Corns and callosities   2. Hammertoe of second toe of right foot      Plan:  Patient was evaluated and treated and all questions answered. -Discussed corns and calluses with patient and treatment options.  -Hyperkeratotic tissue was debrided with chisel without incident.  -Encouraged daily moisturizing -Discussed use of pumice stone -Advised good supportive shoes and inserts. -Did discuss potential for surgery if continues to have pain.  -Patient to return to office as needed or sooner if condition worsens.   Lorenda Peck, DPM

## 2021-12-23 ENCOUNTER — Ambulatory Visit: Payer: Medicare (Managed Care) | Attending: Neurology | Admitting: Neurology

## 2021-12-23 ENCOUNTER — Other Ambulatory Visit: Payer: Self-pay

## 2021-12-23 ENCOUNTER — Encounter: Payer: Self-pay | Admitting: Neurology

## 2021-12-23 VITALS — BP 137/83 | HR 79 | Temp 97.5°F

## 2021-12-23 DIAGNOSIS — G250 Essential tremor: Secondary | ICD-10-CM | POA: Insufficient documentation

## 2021-12-23 NOTE — Progress Notes (Signed)
[facility] Follow up Note    Dear Dr. Aline August, Donnelly Angelica, MD,    We recently had the opportunity to see Christina [name] at the [facility] for a follow up of essential tremor.      As you know, this is a 76 y.o. right-handed woman with a past medical history notable for essential tremor, HTN, DM2, and kidney stones.  She is unaccompanied. She was last seen in February when we started primidone.     HPI:   She reports the primidone was so small that she could not cut it in half. She took the 50mg  nightly and she was so loopy and could not focus so she stopped taking it after 1-2 doses. She is interested in seeing if the pharmacy can score the pill or cut it for her. It did help the tremor but she was super fatigued.     No changes to sense of smell, no light-headedness, no constipation, no urinary issues. No RBD, no hallucinations.     Current tremor meds:  - None     Prior meds tried:  - Propranolol LA up to 120 mg daily - made her lightheaded and felt foggy  - Primidone 50mg  nightly    ROS: pertinent positive and negative symptoms as per HPI.    PMH/PSH:   - HTN  - DM2  - breast cancer s/p lumpectomy and chemoradiation    Social History:    - Lives in [place], has been here for 1 year helping her daughter with her new baby   - Worked as a Marine scientist, several places; she stopped working in 2015  - EtOH use: occasionally, no changes on tremor  - Tobacco use: no  - Marijuana use: no  - Illicit drug use: no    Family History:  - dad had tremor, handwriting deteriorated very badly  - no other relatives with movement disorders    Medications:   Current Outpatient Medications   Medication Sig   ? hydroCHLOROthiazide (HYDRODIURIL) 25 mg tablet Take 1 tablet (25 mg total) by mouth daily   ? OZEMPIC, 0.25 OR 0.5 MG/DOSE, pen SMARTSIG:0.4 Milliliter(s) SUB-Q Once a Week   ? amLODIPine (NORVASC) 5 mg tablet Take 1 tablet (5 mg total) by mouth daily   ?  metFORMIN (GLUCOPHAGE) 500 mg tablet Take 1 tablet (500 mg total) by mouth 2 times daily (with meals)   ? aspirin 81 mg EC tablet Take 1 tablet (81 mg total) by mouth daily   ? potassium chloride (KLOR-CON) 20 MEQ packet Take 20 mEq by mouth 2 times daily   ? primidone (MYSOLINE) 50 mg tablet Take 0.5 tablets (25 mg total) by mouth nightly     Allergies:  Allergies   Allergen Reactions   ? Lisinopril Other (See Comments)     angioedema   ? Acetaminophen Hives      History:  Besides the above, I reviewed and updated as appropriate the problem list, past medical/surgical history, medications, allergies, social history, and family history.  These elements are documented elsewhere in the electronic health record.       EXAMINATION:  Vitals:    12/23/21 1603 12/23/21 1604   BP: 150/79 137/83   BP Location: Left arm Left arm   Patient Position: Sitting Standing   Cuff Size: adult adult   Pulse: 69 79   Temp: 36.4 ?C (97.5 ?F)    TempSrc: Temporal  General: NAD  Chest: Normal WOB.  Extremities:  No deformities.     Neurological:  Mental Status: Awake and alert. Oriented to person, place and time.  Fluent language. Answering questions appropriately. Comprehension intact.  Attention intact.  Appropriate fund of knowledge.  Affect is appropriate for situation.    CN:   II: Visual fields full with no deficits.  III, IV, VI:  Normal conjugate gaze in all directions. No horizontal or vertical nystagmus. Normal pursuits and saccades.     V:  Facial sensation intact and symmetric.  VII:  No facial droop, facial musculature strong. There is no hypomimia.  VIII:  Hearing intact to voice  IX, X:  Palate elevates symmetrically. No hypophonia. No dysarthria or dysphonia. Moderate vocal tremor on casual conversation. On sustained phonation, there is gradual decrease of vocal tone with arrest after a couple of seconds.  XI:  Shoulder shrug strong and symmetric. Slight, intermittent, infrequent head tremor, primarily horizontal. No  cervical dystonia.   XII:  Tongue midline.    Motor:   Muscle bulk: normal in all extremities.  Strength: full 5/5 with shoulder abduction, elbow flexion, hand grip, hip flexion, and ankle dorsiflexion bilaterally.   Tone: normal in the upper and lower extremities, no rigidity.   Kinesis: finger taps and hand open/close are mildly irregular with intrusion of tremor. Arm pronation/supination is normal bilaterally. Heel taps are slightly slow bilaterally, amplitude is normal. Toe taps are normal bilaterally.   Abnormal movements: there is slight rest tremor with distraction only, right>left hand. Slight-mild postural tremor in both hands (MCP flexion/extension), right>left. Mild intention on right hand, slight intention tremor on left hand. Slight-mild kinetic tremor.     Reflexes:  2+ DTR bilaterally throughout.    Sensory: Light touch sensation is intact and symmetric in the limbs.     Coordination: No dysmetria on finger-to-nose bilaterally.    Gait: Able to rise from a chair with arms crossed without difficulty.  Casual gait is stable with normal base, stride length, and arm swing.     IMPRESSION:  - Essential tremor    Christina [name] is a 76 y.o. right-handed woman with a history notable for essential tremor, nephrolithiasis, HTN, and DM2, who presents for follow up of essential tremor. Unfortunately, she could not cut her pills and ended up taking 50mg  of primidone from the start and did not tolerate the medication. She is currently not taking anything for her tremor. The primidone did help her tremor the couple days she took it. We discussed 2nd line treatments for ET including topamax and gabapentin. Topamax would be contraindicated as she has a history of kidney stones. Instead of gabapentin, she is interested in trying primidone again and obtaining a pill cutter to see if she could tolerate the lower dose.       PLAN:  - Try primidone 25mg  nightly again, try to obtain pill cutter  - Discussed weighted utensils  to help with tremor   - Follow up in **    Thank you for the opportunity to provide care for your patient.  The patient was seen and discussed with attending Dr. Samara Deist.      Chales Salmon, DO  Neurology PGY-4  12/23/2021 5:14 PM  [phone]

## 2021-12-23 NOTE — Patient Instructions (Addendum)
Retry primidone 25mg  nightly. Try to obtain a pill cutter if possible  Follow up in 3 months with Christina Addison NP      Please reach out to the clinic with any additional questions or concerns. We encourage you to sign up and use MyChart for any non-urgent medical questions as MyChart is the preferred method of communication.  Please contact your pharmacy for medication refills and allow up to 2 business days for routine prescription refills.  For urgent messages, please call the clinic at [phone].  The clinic is open from 8am to 4:30pm Monday through Friday.    You can get [facility] appointment reminders by text - please make sure we have your cell phone number on file at check in/check-out.  Then you can just text [facility] to [phone] to receive appointment messages.

## 2021-12-26 ENCOUNTER — Encounter: Payer: Medicare (Managed Care) | Admitting: Endocrinology

## 2021-12-26 NOTE — Telephone Encounter (Unsigned)
Copied from CRM [id number]. Topic: Appointments - Appointment Directions  >> Dec 26, 2021  2:57 PM Madalyn Rob wrote:  Christina [name] called regarding her appt that she missed today with Dr Susa Griffins NPV   Patient stated she was so sorry that she thought her appt was tomorrow     Patient is not scheduled for 04/02/22 with Dr Karlton Lemon     Patient can be reached at [phone]

## 2021-12-27 NOTE — Progress Notes (Signed)
This encounter was created in error - please disregard.

## 2022-04-02 ENCOUNTER — Other Ambulatory Visit: Payer: Self-pay

## 2022-04-02 ENCOUNTER — Ambulatory Visit: Payer: Medicare (Managed Care) | Admitting: Internal Medicine

## 2022-04-02 ENCOUNTER — Other Ambulatory Visit
Admission: RE | Admit: 2022-04-02 | Discharge: 2022-04-02 | Disposition: A | Discharge status: Home / Self Care | Payer: Medicare (Managed Care) | Source: Ambulatory Visit | Attending: Internal Medicine | Admitting: Internal Medicine

## 2022-04-02 VITALS — BP 170/77 | HR 63 | Ht 63.78 in | Wt 150.0 lb

## 2022-04-02 DIAGNOSIS — E119 Type 2 diabetes mellitus without complications: Secondary | ICD-10-CM | POA: Insufficient documentation

## 2022-04-02 LAB — LIPID PANEL
Chol/HDL Ratio: 3.5
Cholesterol: 215 mg/dL — AB
HDL: 62 mg/dL — ABNORMAL HIGH (ref 40–60)
LDL Calculated: 140 mg/dL — AB
Non HDL Cholesterol: 153 mg/dL
Triglycerides: 65 mg/dL

## 2022-04-02 LAB — BASIC METABOLIC PANEL
Anion Gap: 11 (ref 7–16)
CO2: 31 mmol/L — ABNORMAL HIGH (ref 20–28)
Calcium: 9.9 mg/dL (ref 8.6–10.2)
Chloride: 97 mmol/L (ref 96–108)
Creatinine: 0.61 mg/dL (ref 0.51–0.95)
Glucose: 104 mg/dL — ABNORMAL HIGH (ref 60–99)
Lab: 17 mg/dL (ref 6–20)
Potassium: 3.6 mmol/L (ref 3.3–5.1)
Sodium: 139 mmol/L (ref 133–145)
eGFR BY CREAT: 92 *

## 2022-04-02 LAB — MICROALBUMIN, URINE, RANDOM
Creatinine,UR: 55 mg/dL (ref 20–300)
Microalbumin,UR: <1.2 mg/dL

## 2022-04-02 LAB — VITAMIN B12: Vitamin B12: 533 pg/mL (ref 232–1245)

## 2022-04-02 NOTE — Progress Notes (Signed)
Division [facility]in[facility], 3rd Floor  [address], [place], [place] [address]  Phone: [phone]    [facility] Outpatient Consultation Note   Christina [name] (MRN: [id number])  Consult requested by: Dr. Aline August    Reason for Visit:   Christina [name] is a 76 y.o. female that is here to establish care for diabetes management.    History of Present Illness:   Christina [name] is a 76 y.o. female that presents today for diabetes care. Additional medical history includes hypertension, breast cancer status-post lumpectomy and essential tremors.    Christina [name] was diagnosed with diabetes about 5 years ago. At that time she had symptoms including polyuria and polydipsia and she was evaluated with her provider in [place] (her long term home). At that time she did a trial of lifestyle modification and was eventually put on metformin therapy. Today she continues on metformin 500 mg BID, which she is tolerating well. About 1.5 years ago she was started on ozempic 0.25 mg weekly, which was titrated to 0.5 mg weekly, which she is also currently taking and tolerating. She checked her glucose periodically. She is generally very active.    Diabetes Specific History:   Diabetes history:  Diabetes type: T2D  Time and setting of diagnosis: based on screening labs based on symptoms   History of ketosis: No history of DKA  Current Medications:   Non-insulin agents: metformin 500 mg BID, ozempic 0.5 mg weekly   Insulins: None  Monitoring:   Type of monitor: Glucometer   Frequency of monitoring: infrequently, as needed  Diabetes Complications Status:  Eyes: No known retinopathy  Kidneys: No known CKD  Feet: No history of ulceration, sores or skin infections  Sensory: No known neuropathy, denies numbness and tingling  Autonomic: No known gastroparesis or orthostatic hypotension  CVD: No history of MI, CHF or CVA    Past Medical History:     Patient Active Problem List   Diagnosis Code   .  Essential tremor G25.0     Past Surgical History:     Past Surgical History:   Procedure Laterality Date   . BREAST LUMPECTOMY Right      Family History:     No family history on file.  Social History:     Social History     Tobacco Use   . Smoking status: Never   . Smokeless tobacco: Never     Allergies:   Allergies were reviewed and confirmed in the Allergies tab of eRecord.  Allergies   Allergen Reactions   . Lisinopril Other (See Comments)     angioedema   . Acetaminophen Hives     Medications:   Medications were reviewed and confirmed in the Medications tab of eRecord.     Objective:   Vitals:   Vitals:    04/02/22 1136   BP: 170/77   Pulse: 63   Weight: 68 kg (150 lb)   Height: 1.62 m (5' 3.78")   Repeat manual BP 142/85 mmHg    Physical Exam:  Constitutional: No acute distress, well developed.  Cardiovascular: Regular rate and rhythm, no murmurs rubs or gallops. No edema.  Respiratory: Clear to auscultation bilaterally.  Gastrointestinal: Normal bowel sounds. Soft, nontender, nondistended.  Neurologic: Alert and oriented to conversation. DTRs 2+ and equal bilaterally. Normal proximal muscle strength without muscle weakness or wasting.   Psychiatric: Normal mood and affect.  Skin: No acanthosis or striae.   Feet: No  wounds, sores or scabbing appreciated. Toenails are intact. Distal pulses 2+. Corn present over right second toe.    Assessment:   Christina [name] is a 76 y.o. female that presents today for diabetes care. Additional medical history includes hypertension, breast cancer status-post lumpectomy and essential tremors.    Today we discussed the natural history of diabetes and microvascular and macrovascular complications. HbA1c goal of 7.0% or less, and fasting/post-prandial glucose targets recommended by the American Diabetes Association were reviewed and are outlined in detail below. Her last HbA1c was found to be 6.3% in 08/2021 (in Care Everywhere), which is excellent and below goal. It was recommended  today to update this when able.    To simplify her regimen we have discussed using ozempic only and stopping metformin. Ozempic, and GLP1RA therapy was reviewed today. GLP-1 agonists should be avoided in patients at higher risk for pancreatitis, pancreatic cancer, and medullary thyroid cancer and those with known gastroparesis. Common side effects include nausea (approximately 18%), vomiting and diarrhea, which she fortunately has not experienced. If follow up HbA1c is >7%, it was recommended to increase ozempic to 1 mg weekly and stop metformin completely. If HbA1c is <7% then we will stop metformin alone and follow glucose monitoring.    To screen for the microvascular complications of diabetes an annual dilated eye exam, foot exam (done today) and urine microalbumin testing (ordered) were recommended. Lipid and blood pressure control are also tenants of diabetes care and risk reduction, these prinicples were reviewed today. Current blood pressure is above goal control, closer to goal with repeat assessment. She was amenable to begin home blood pressure monitoring and reaching out for medication adjustments if >130/80 mmHg.       Recommendations:   Diabetes  - Diabetes related screening labs recommended, urine microalbumin, lipid panel and BMP  - Add vitamin b12 given long term metformin use  - If HbA1c >7%, increase ozempic to 1 mg weekly and stop metformin    Maintenace and preventive diabetes care:  - Recommended daily exercising, aiming for a total of 150 minutes weekly or more  - Nutrition counseling recommended  - Next eye exam due: referral to [facility] placed, she may go to usual provider in [place]  - Next urine microalbumin: recommended now, ordered    Hyperlipidemia  - LDL goal of 100 mg/dL or less was discussed  - Repeat lipid panel due: recommended and ordered now    The American Diabetes Association recommends the following blood glucose goals which were encouraged during this visit:   - Goal fasting blood  glucose 90-120 mg/dL  - Goal 2-hour post-prandial (after eating) blood glucose <180 mg/dL  - General GNF6O goal <1.3%, general blood glucose goal 100-200    Christina [name] was advised to return to clinic in 3 months. Follow up should ideally be scheduled during check out today to ensure continuity with the Endocrinology Clinic. If unable to schedule follow up during check out, clinic contact information is provided on the after visit summary (given to patient or accessed via MyChart).    Approximately 45 minutes were spent before, during and after this patient encounter on medical care for [facility].     Judith Part DO, MPH  Division of Endocrinology, Diabetes and Metabolism

## 2022-04-03 LAB — HEMOGLOBIN A1C: Hemoglobin A1C: 6.9 % — ABNORMAL HIGH

## 2022-04-10 ENCOUNTER — Encounter: Payer: Self-pay | Admitting: Internal Medicine

## 2022-04-30 NOTE — Progress Notes (Signed)
[facility]  Urgent Care Visit  Dameria Pribble  DOB: 1945/10/04   MRN: [id number]   Visit Date: 04/30/2022  Encounter Provider: Mariea Clonts, MD    PCP: Mariea Clonts, MD    Subjective:     Christina [name] is a 76 y.o. female who presents for urgent care visit with left flank pain      Left flank pain  Ongoign for 3 weeks  Dull pain  Getting out of bed or changing position provokes pain  She has not tried anything for this      Assessment/Plan:     1. Low back pain, unspecified back pain laterality, unspecified chronicity, unspecified whether sciatica present    - XR SPINE LUMBAR (AP+LAT); Future  - naproxen (NAPROSYN) 500 MG tablet; Take 1 tablet (500 mg total) by mouth 2 times daily with meals for 30 days.  Dispense: 60 tablet; Refill: 0  - XR SPINE THORACIC (AP+LAT); Future    2. Controlled type 2 diabetes mellitus without complication, without long-term current use of insulin (HCC)  Continue Ozempic  - Hemoglobin A1c; Future  - Basic metabolic panel; Future  - CBC; Future    Electronically signed by: Mariea Clonts, MD 04/30/2022 11:21 AM    No follow-ups on file.      This note was dictated with voice recognition software. Similar sounding words may be inadvertently transcribed incorrectly.     Orders: No orders of the defined types were placed in this encounter.      Medications: No orders of the defined types were placed in this encounter.        Objective:   BP 110/71   Pulse 65   Temp 97.9 ?F (36.6 ?C) (Temporal)   Wt 69.8 kg (153 lb 12.8 oz)   LMP  (LMP Unknown)   SpO2 99%   BMI 26.40 kg/m?     Physical Exam  Cardiovascular:      Rate and Rhythm: Normal rate.      Pulses: Normal pulses.      Heart sounds: Normal heart sounds. No murmur heard.     No friction rub. No gallop.   Pulmonary:      Effort: Pulmonary effort is normal. No respiratory distress.      Breath sounds: Normal breath sounds. No stridor. No wheezing, rhonchi or rales.   Chest:      Chest wall: No  tenderness.   Neurological:      Mental Status: She is alert.           Patient Active Problem List    Diagnosis Date Noted   ? Bilateral impacted cerumen 02/09/2020   ? 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 Long term current use of oral hypoglycemic drug 06/27/2019   ? PCO (posterior capsular opacification), bilateral 06/27/2019   ? Posterior vitreous detachment of both eyes 06/27/2019   ? Presbyopia of both eyes 06/27/2019   ? Pseudophakia of both eyes 06/27/2019   ? Dermatochalasis of both upper eyelids 06/27/2019   ? Family history of glaucoma 06/27/2019   ? Airway obstruction 06/12/2019   ? Angioedema due to angiotensin converting enzyme inhibitor (ACE-I) 06/12/2019   ? Renal cyst 02/13/2018   ? Tremor 10/01/2017   ? History of infiltrating ductal carcinoma of right breast 07/08/2017   ? History of breast cancer 06/16/2017   ? Controlled type 2 diabetes mellitus without complication (HCC) 08/13/2015   ? Essential hypertension 05/25/2014   ?  Hyperlipidemia 05/25/2014       Updated Medication List:          alcohol swabs (BD ALCOHOL SWABS)    Sig: Dx E 11.9 Use as directed to clean insulin injection site once daily    amLODIPine (NORVASC) 5 MG tablet    Sig: TAKE ONE (1) TABLET BY MOUTH EACH DAY    aspirin 81 MG EC tablet    Sig - Route: Take 1 tablet (81 mg total) by mouth daily. - Oral    Class: Historical Med    blood sugar diagnostic (ONETOUCH VERIO TEST STRIPS) Strp test strips    Sig: TEST BLOOD SUGAR ONCE PER DAY DX: E11.9    EPINEPHrine (EPI-PEN) 0.3 mg/0.3 mL auto-injector    Sig - Route: Inject 0.3 mLs (0.3 mg total) into the muscle once as needed for up to 1 dose for Anaphylaxis. - Intramuscular    hydroCHLOROthiazide (HYDRODIURIL) 25 MG tablet    Sig: TAKE ONE (1) TABLET BY MOUTH EACH DAY    naproxen (NAPROSYN) 500 MG tablet    Sig: TAKE ONE TABLET BY MOUTH TWICE DAILY WITH MEALS FOR 7 DAYS    pen needle, diabetic, safety 29 gauge x 5/16" Ndle    Sig: Dx E11.9 Use once daily with insulin    potassium  chloride ER (KLOR-CON-M, K-DUR) 20 MEQ extended release tablet    Sig - Route: Take 1 tablet (20 mEq total) by mouth 2 times daily. - Oral    propranolol LA (INDERAL LA) 80 MG 24 hr capsule    Sig - Route: Take 1 capsule (80 mg total) by mouth daily. - Oral    semaglutide (OZEMPIC) 0.25 mg or 0.5 mg (2 mg/3 mL) injection    Sig - Route: Inject 0.5 mg into the skin once a week. - Subcutaneous            Allergies   Allergen Reactions   ? Lisinopril Anaphylaxis (ALLERGY)   ? Acetaminophen Urticaria / Hives (ALLERGY)     Causes wheels and hives         Labs:  No results found for this or any previous visit (from the past 168 hour(s)).    Electronically signed by: Mariea Clonts, MD 04/30/2022 9:21 AM        Electronically signed by: Donia Ast, MD  04/30/22 1341

## 2022-05-05 ENCOUNTER — Ambulatory Visit: Payer: Medicare (Managed Care)

## 2022-06-16 ENCOUNTER — Ambulatory Visit: Payer: Medicare (Managed Care) | Admitting: Optometry

## 2022-06-16 NOTE — Telephone Encounter (Signed)
Writer attempted to call pt to [facility] eye appt on 06/16/22 due to Doctor being out.  If pt calls back please reach out to Arafat Cocuzza/Lisa/Rachel for a sooner [facility].

## 2022-07-22 ENCOUNTER — Inpatient Hospital Stay: Admit: 2022-07-22 | Discharge: 2022-07-22 | Disposition: A | Discharge status: Home / Self Care | Payer: Self-pay

## 2022-07-30 ENCOUNTER — Inpatient Hospital Stay: Admit: 2022-07-30 | Discharge: 2022-07-30 | Disposition: A | Discharge status: Home / Self Care | Payer: Self-pay

## 2022-07-30 ENCOUNTER — Ambulatory Visit: Payer: Medicare (Managed Care) | Admitting: Psychiatry

## 2022-07-30 HISTORY — PX: BREAST BIOPSY: SHX20

## 2022-08-15 ENCOUNTER — Telehealth: Payer: Self-pay | Admitting: Internal Medicine

## 2022-08-15 MED ORDER — SEMAGLUTIDE (0.25 OR 0.5MG/DOSE) 2 MG/3ML SC SOPN *I*
0.5000 mg | PEN_INJECTOR | SUBCUTANEOUS | 1 refills | Status: DC
Start: 2022-08-15 — End: 2024-03-24

## 2022-08-15 NOTE — Telephone Encounter (Signed)
Script sent for pt with note that she left script in [place] and will be in [place] for several months. Christina Carnes, NP

## 2022-08-15 NOTE — Telephone Encounter (Signed)
Copied from CRM [id number]. Topic: Medications/Prescriptions - Medication Question/Problem  >> Aug 15, 2022 12:44 PM Earlene Plater wrote:  Christina [name], Patient, calling to request prescription(s) Ozempic to be sent to the following Pharmacy [facility] - [place], [place] - [address]    The patient left her prescription in [place] and will be in [place] for several months    Is patient out of the medication? Yes, she is requesting it be filled today     Patient's last visit? 7/26    Were the demographics and insurance verified?: Yes    Patient can be reached if necessary at [phone] (mobile)  .

## 2022-08-18 ENCOUNTER — Encounter: Payer: Medicare (Managed Care) | Admitting: Optometry

## 2022-08-18 NOTE — Progress Notes (Signed)
Patient not seen, no show.

## 2022-08-19 DIAGNOSIS — C50912 Malignant neoplasm of unspecified site of left female breast: Secondary | ICD-10-CM | POA: Insufficient documentation

## 2022-08-25 ENCOUNTER — Other Ambulatory Visit
Admission: RE | Admit: 2022-08-25 | Discharge: 2022-08-25 | Disposition: A | Discharge status: Home / Self Care | Payer: Medicare (Managed Care) | Source: Ambulatory Visit

## 2022-08-25 DIAGNOSIS — N641 Fat necrosis of breast: Secondary | ICD-10-CM

## 2022-08-25 DIAGNOSIS — C50412 Malignant neoplasm of upper-outer quadrant of left female breast: Secondary | ICD-10-CM

## 2022-08-25 DIAGNOSIS — Z170 Estrogen receptor positive status [ER+]: Secondary | ICD-10-CM

## 2022-08-26 LAB — SURGICAL PATHOLOGY

## 2022-08-27 ENCOUNTER — Other Ambulatory Visit: Payer: Self-pay

## 2022-09-02 ENCOUNTER — Telehealth: Payer: Self-pay | Admitting: Surgical Oncology

## 2022-09-02 NOTE — Telephone Encounter (Signed)
Tried calling patient back but phone just rings and then disconnects.  Will write patient a mychart message.

## 2022-09-02 NOTE — Telephone Encounter (Signed)
Called patient to confirm dates. Patient is going to call back at a later time as she was driving     Completed                 Outpatient   .             Surgery 1/5  .             LUMPECTOMY [737]  .             PAC 1/2 @ 8:00                NUCMED 1/5 @ 8:15                POV- 1/16 @ 1:00  .             Patient notified/letter mailed  .             Surgery marked ready to schedule    Noted to the calendar

## 2022-09-02 NOTE — Telephone Encounter (Signed)
Calling Tamara back

## 2022-09-02 NOTE — Progress Notes (Signed)
Breast New Patient Pathway   REGISTRATION:    Date of referral: 08/20/2022   Referring Provider: Other   Other referring provider: Self   Reason for Referral: Other   Other Reason for Referral: TOC   Diagnosis: L IDC ER/PR+ HER2-   Diagnosis date (pathology date): 07/30/2022   Appointment date: 09/09/2022   Discipline: Surgical Oncology   Nurse Navigator encounter type: Chart Review  TREATMENT:    Evaluation by Surgical Oncology prior to Medical Oncology appointment: Yes   Date of first treatment pending? Yes  Time spent on this encounter (in minutes): 25    Nurse navigator completed chart review and will be available for any additional support and care coordination as needed throughout this patients potential treatment/care at [facility].     History:  - Personal hx of R breast ca in 2011  - S/p R lumpectomy w/ SLNBx 04/2010  - Surgical path showed ER/PR- HER2+ breast ca  - S/p adjuvant TC with Herceptin   - S/p adjuvant whole breast RT  - S/p L mammoplasty 12/2011  - No recorded family history of cancer    Imaging: [facility] (see chart review)

## 2022-09-03 ENCOUNTER — Encounter: Payer: Self-pay | Admitting: Surgical Oncology

## 2022-09-03 DIAGNOSIS — C50412 Malignant neoplasm of upper-outer quadrant of left female breast: Secondary | ICD-10-CM

## 2022-09-03 HISTORY — DX: Malignant neoplasm of upper-outer quadrant of left female breast: C50.412

## 2022-09-09 ENCOUNTER — Other Ambulatory Visit: Payer: Self-pay

## 2022-09-09 ENCOUNTER — Encounter: Payer: Self-pay | Admitting: Surgical Oncology

## 2022-09-09 ENCOUNTER — Ambulatory Visit: Payer: Medicare (Managed Care) | Attending: Surgical Oncology | Admitting: Surgical Oncology

## 2022-09-09 ENCOUNTER — Telehealth: Payer: Self-pay

## 2022-09-09 VITALS — BP 162/70 | HR 73 | Temp 97.9°F | Wt 149.8 lb

## 2022-09-09 DIAGNOSIS — C50412 Malignant neoplasm of upper-outer quadrant of left female breast: Secondary | ICD-10-CM

## 2022-09-09 DIAGNOSIS — G250 Essential tremor: Secondary | ICD-10-CM | POA: Insufficient documentation

## 2022-09-09 DIAGNOSIS — Z170 Estrogen receptor positive status [ER+]: Secondary | ICD-10-CM | POA: Insufficient documentation

## 2022-09-09 NOTE — H&P (Signed)
[facility]  [id number]    Referring Physician: Donia Ast, MD     [address]  [place] [address]     [phone]    Chief Complaint: The patient is referred for consultation regarding management of her newly diagnosed breast cancer.    HPI: Christina [name] is a pleasant 77 y.o. Black female seen on 09/09/2022 for consultation at [facility]. Her oncologic history is as follows:     Oncology History Overview Note   Breast Cancer Risk Factors:  FAMILY HISTORY: No breast, ovarian, pancreatic or prostate cancer. No previous genetic testing; Age at Menarche:  63; OCP Use: No; Fertility Treatment?: No; G1, P1; Age at First Live Birth: 89; Total months breastfeeding: 8; Menopausal status: postmenopausal. Postmenopausal HRT: No. BMI at diagnosis: Body mass index is 25.89 kg/m.; Mammographic density: unk.  # Prior breast biopsies: 1. H/O atypia:  no; H/O breast cancer:  yes 2011 - treatment in [place]; lumpectomy w/ SLNBx, ER/PR- HER2+, treated with TC-herceptin and radiation. T2N1a. Prior thoracic radiation? no.     Breast cancer of upper-outer quadrant of left female breast   07/22/2022 Significant Radiology Findings    Screening mammo/US [facility] [place]) 07/22/22    Mammographically, there is a dense irregular mass in the axillary tail of the left breast with at least one enlarged left axillary lymph node included in the imaging field. No suspicious microcalcifications on the left.     There are stable lumpectomy changes with densely calcified fat necrosis in the central retroareolar right breast.     On targeted ultrasound, There is a 2.0 x 1.2 x 1.9 cm heterogeneously hypoechoic irregular mass with angular and indistinct margins at 2:00 6 cm from the nipple in the left breast.     There are two immediately adjacent hypoechoic lymph nodes with loss of fatty hila at the site of a palpable mass in the inferior left axilla. The larger  lymph node measures 2.7 x 1.3 x 2.6 cm. More laterally in the inferior left axilla are two additional hypoechoic lymph nodes with loss of fatty hila; one of them measuring 1.5 x 1.0 x 1.5 cm.      07/28/2022 Significant Radiology Findings    CT Chest Abdomen Pelvis [facility] [place]) 07/28/22    1. Left axillary adenopathy measuring up to 1.7 cm in short axis, with an additional 1.2 by 1.1 cm mass or lymph node along the left lateral breast glandular tissue.     2. No findings of metastatic disease to the abdomen/pelvis or chest.     3. Large hiatal hernia containing stomach as well as part of the pancreatic tail and splenic vein.     4. Prominent stool throughout the colon favors constipation.     5. Uterine fibroids.     6. Thoracolumbar scoliosis and spondylosis with degenerative disc disease in the lumbar spine.     7. Aortic and coronary artery atherosclerosis. There is atheromatous plaque at the origin of the celiac trunk and SMA dorsally without occlusion.     8. Increased peripheral calcification along the right breast lumpectomy site, likely therapy related.     9. Other imaging findings of potential clinical significance: Coronary atherosclerosis. Simple and complex renal cysts, surveillance in the context of the patient's underlying cancer follow up imaging suggested. 2 mm left kidney lower pole nonobstructive renal calculus.      07/30/2022  Significant Lab Findings    Left breast and axillary node biopsy [facility] [place]) 07/30/22    A. Breast, left 2:00 6CMFN     Invasive ductal carcinoma with necrosis, grade 2 (tubules 3, nuclei 2, mitosis 1)    B. Breast, left "site 2"    Invasive ductal carcinoma, grade 2 (tubules 3, nuclei 2, mitosis 1)    ER positive 95% strong  PR positive 2% strong  HER2 2+ equivocal  FISH nonamplified (ratio 1.23; coy number 3.23)     09/03/2022 Initial Diagnosis    Breast cancer of upper-outer quadrant of left female breast     09/03/2022 Cancer Staged    Staging form: Breast,  AJCC 8th Edition  - Clinical stage from 09/03/2022: Stage IB (cT1c, cN1(f), cM0, G2, ER+, PR+, HER2-)         [name] was living in [place] and had noted a palpable lymph node in her left axilla. The mass in her left breast was less easy for her to palpate. Her workup was done via [facility] and biopsies demonstrated a left breast mass and involved axillary node, systemic imaging did not demonstrate distant disease. However there was concern for at least 4 involved nodes on the mammogram/US. She comes in today to discuss her management options.    Past Breast History:   Her prior breast cancer treatment history is as above. She cannot recall her age of first screening but does go annually. She does perform breast self examination on a monthly basis.    Medical History:   Past Medical History:   Diagnosis Date    Diabetes     High blood pressure          Medications:   Current Outpatient Medications   Medication Sig    EPINEPHrine (EPIPEN) 0.3 mg/0.3 mL auto-injector Inject 0.3 mLs (0.3 mg total) into the muscle once as needed    blood glucose (ONETOUCH VERIO) test strip TEST BLOOD SUGAR ONCE PER DAY DX: E11.9    insulin glargine (LANTUS SOLOSTAR) 100 unit/mL injection pen Inject 14 units into the skin nightly    semaglutide, 0.25 or 0.5 mg/dose, (OZEMPIC) pen Inject 0.75 mLs (0.5 mg total) into the skin once a week    hydroCHLOROthiazide (HYDRODIURIL) 25 mg tablet Take 1 tablet (25 mg total) by mouth daily    amLODIPine (NORVASC) 5 mg tablet Take 1 tablet (5 mg total) by mouth daily    potassium chloride (KLOR-CON) 20 MEQ packet Take 20 mEq by mouth 2 times daily     No current facility-administered medications for this visit.       Allergies: is allergic to lisinopril, acetaminophen, and eggs or egg-derived products.    Surgical history:   Past Surgical History:   Procedure Laterality Date    BREAST LUMPECTOMY Right        Social:   Social History     Socioeconomic History    Marital status: Unknown   Tobacco  Use    Smoking status: Never    Smokeless tobacco: Never          Problem List:    Patient Active Problem List   Diagnosis Code    Essential tremor G25.0    Breast cancer of upper-outer quadrant of left female breast C50.412       Family History: No family history on file.   No family status information on file.       Review of Systems: Comprehensive ROS was taken  on the patient intake form and reviewed with the patient. Pertinent items are noted in HPI.      Physical Exam:  Vitals: BP 162/70   Pulse 73   Temp 36.6 C (97.9 F)   Wt 67.9 kg (149 lb 12.8 oz)   SpO2 97%   BMI 25.89 kg/m  ECOG PS = 0  General appearance: alert, appears stated age, and cooperative  Head: Normocephalic, without obvious abnormality, atraumatic  Eyes: conjunctivae/corneas clear. PERRL, EOM's intact.  Ears: External ears are normal, hearing is grossly intact.  Nose: Nares normal and patent.  Throat: lips, mucosa, and tongue normal; teeth and gums normal  Neck: no adenopathy and supple, symmetrical, trachea midline  Back: symmetric, no curvature. ROM normal. No CVA tenderness.  Lungs: clear to auscultation bilaterally  Heart: regular rate and rhythm  Abdomen: Soft, nontender, nondistended, no organomegaly, no palpable masses.  Extremities: extremities normal, atraumatic, no cyanosis or edema  Pulses: 2+ and symmetric  Skin: Skin color, texture, turgor normal. No rashes or lesions  Lymph nodes:  large mobile palpable low axillary nodes on the left. Well healed incision in the right axilla.  Neurologic: Grossly normal  Breasts: Comprehensive breast examination was performed in the seated and supine positions. The breasts are of moderate size and moderately ptotic/pendulous. The breasts are symmetric with normal shape and contour. The skin is without erythema, edema, nodules or other lesions. Nipples are normal and everted.There are no skin changes on arm maneuvers. On palpation there are no masses palpable in the right breast. No  discharge is expressible. There is a well healed right breast lumpectomy incision with nipple reconstruction. In the left breast at 2:00 8CMFN is a 2cm mass. There are well healed reduction incisions.    Imaging:   All breast imaging studies were personally reviewed by [place] and findings are per the HPI.    Pathology:   All pathology reports were personally reviewed by [place] and findings are per the HPI. Slides have been requested for our review.      Impression/Plan:  We reviewed her work-up to date and the results. We briefly reviewed the epidemiology, diagnosis, prognosis and treatment options available for patients with early breast cancer. We discussed the multidisciplinary treatment of breast cancer.      Most of our discussion was focused on her surgical options which include: breast conserving therapy (partial mastectomy followed by radiation therapy to the remaining breast tissue) vs mastectomy with or without reconstruction. She understands that, in the absence of specific contraindications, these 2 surgical approaches are felt to be equivalent in the treatment of early stage breast cancer. The risks and benefits of both approaches were discussed in detail. Risks common to both surgical approaches include the risk of bleeding, infection, fluid collection, recurrence. We discussed the importance of adequate surgical margins for successful breast conserving therapy and the potential need for further surgery to reexcise close or positive margins.                 This tumor will not require localization if we proceed to surgery prior to systemic therapy but might require saviscout localization if she has up front chemotherapy.                We also explained the need for lymph node assessment and the technique of sentinel lymph node biopsy was reviewed. The risk of arm numbness or swelling was discussed. Because of the published results of the ACOSOG Z0011 study, completion dissection  is no longer considered  routine after a positive sentinel node. However as she already has a biopsy proven positive node and the appearance of several positive nodes on imaging if we proceed to surgery as our first method of treatment I will recommend full axillary dissection. This comes with at least 30% risk of lymphedema as a potential side effect.                                          If she chooses to proceed with mastectomy we can discuss reconstructive options and refer her to a plastic surgeon; however at this time she is considering lumpectomy.                 We reviewed the role of radiation and how it is an essential component of breast conserving approaches. We reviewed the indications for postmastectomy radiation and pointed out that although many women who undergo mastectomy do not require radiation, choosing mastectomy does not ensure the avoidance of radiation. We outlined the time requirements for standard whole breast or postmastectomy radiation and briefly explained that in highly selected individuals, shorter courses of radiation may be possible. These decisions require final pathology results and she will be referred to a radiation oncologist once the pathology is available.                 We also discussed with her the concept of micrometastases and the role of adjuvant systemic therapy to decrease the risk of recurrence. We discussed the various prognostic factors that influence the decisions regarding systemic therapies, including the status of the axillary lymph nodes, tumor size, tumor expression of the hormone receptors and/or the HER2 oncoprotein. Because all of this information is needed to make informed decisions regarding the need for chemotherapy, we cannot make final recommendations until after the final pathology is available. However given that her imaging suggests a 2cm mass which is already palpable and possibly at least 4 involved nodes on her ultrasound, a discussion of up front systemic therapy  and possible enrollment on ISPY clinical trial could be considered. I will ask her to have this consultation prior to making final surgical decisions; neoadjuvant therapy might enable [place] to minimize in particular the extent of axillary surgery required by making her a candidate for targeted sentinel node biopsy.                All of her questions were answered and at the end of our discussion she expressed her desire to proceed with MRI, genetics and medical oncology appointment prior to making final treatment decisions and we will make the necessary arrangements for that.               I spent approximately 60 minutes on the calendar day including time spent with the patient, preparing for the visit, reviewing and interpreting imaging and pathology reports where appropriate, discussing with other providers and completing documentation.

## 2022-09-09 NOTE — Telephone Encounter (Signed)
Called patient and scheduled genetic consult

## 2022-09-11 ENCOUNTER — Ambulatory Visit: Payer: Medicare (Managed Care)

## 2022-09-11 ENCOUNTER — Encounter: Payer: Self-pay | Admitting: Internal Medicine

## 2022-09-11 ENCOUNTER — Other Ambulatory Visit: Payer: Self-pay

## 2022-09-11 ENCOUNTER — Ambulatory Visit: Payer: Medicare (Managed Care) | Attending: Internal Medicine | Admitting: Internal Medicine

## 2022-09-11 VITALS — BP 146/78 | HR 61 | Temp 97.5°F | Resp 17 | Ht 62.68 in | Wt 147.9 lb

## 2022-09-11 DIAGNOSIS — Z170 Estrogen receptor positive status [ER+]: Secondary | ICD-10-CM | POA: Insufficient documentation

## 2022-09-11 DIAGNOSIS — C50412 Malignant neoplasm of upper-outer quadrant of left female breast: Secondary | ICD-10-CM

## 2022-09-11 LAB — COMPREHENSIVE METABOLIC PANEL
ALT: 19 U/L (ref 0–35)
AST: 20 U/L (ref 0–35)
Albumin: 4.6 g/dL (ref 3.5–5.2)
Alk Phos: 110 U/L — ABNORMAL HIGH (ref 35–105)
Anion Gap: 12 (ref 7–16)
Bilirubin,Total: 0.9 mg/dL (ref 0.0–1.2)
CO2: 29 mmol/L — ABNORMAL HIGH (ref 20–28)
Calcium: 10 mg/dL (ref 8.6–10.2)
Chloride: 99 mmol/L (ref 96–108)
Creatinine: 0.64 mg/dL (ref 0.51–0.95)
Glucose: 119 mg/dL — ABNORMAL HIGH (ref 60–99)
Lab: 20 mg/dL (ref 6–20)
Potassium: 4.6 mmol/L (ref 3.3–5.1)
Sodium: 140 mmol/L (ref 133–145)
Total Protein: 6.9 g/dL (ref 6.3–7.7)
eGFR BY CREAT: 91 *

## 2022-09-11 LAB — CBC AND DIFFERENTIAL
Baso # K/uL: 0 10*3/uL (ref 0.0–0.2)
Basophil %: 0.6 %
Eos # K/uL: 0 10*3/uL (ref 0.0–0.5)
Eosinophil %: 0.9 %
Hematocrit: 40 % (ref 34–49)
Hemoglobin: 12.7 g/dL (ref 11.2–16.0)
IMM Granulocytes #: 0 10*3/uL (ref 0.0–0.0)
IMM Granulocytes: 0.3 %
Lymph # K/uL: 1.2 10*3/uL (ref 1.0–5.0)
Lymphocyte %: 33.1 %
MCH: 29 pg (ref 26–32)
MCHC: 32 g/dL (ref 32–36)
MCV: 89 fL (ref 75–100)
Mono # K/uL: 0.2 10*3/uL (ref 0.1–1.0)
Monocyte %: 6.3 %
Neut # K/uL: 2 10*3/uL (ref 1.5–6.5)
Nucl RBC # K/uL: 0 10*3/uL (ref 0.0–0.0)
Nucl RBC %: 0 /100 WBC (ref 0.0–0.2)
Platelets: 281 10*3/uL (ref 150–450)
RBC: 4.4 MIL/uL (ref 4.0–5.5)
RDW: 13.1 % (ref 0.0–15.0)
Seg Neut %: 58.8 %
WBC: 3.5 10*3/uL (ref 3.5–11.0)

## 2022-09-11 LAB — HEPATITIS B SURFACE ANTIGEN: HBV S Ag: NEGATIVE

## 2022-09-11 LAB — HEPATITIS B SURFACE ANTIBODY
HBV S Ab Quant: 0.37 m[IU]/mL
HBV S Ab: NEGATIVE

## 2022-09-11 LAB — HEPATITIS B CORE ANTIBODY, TOTAL: HBV Core Ab: NEGATIVE

## 2022-09-11 LAB — HEB B INT

## 2022-09-11 LAB — HEPATITIS C ANTIBODY: Hep C Ab: NEGATIVE

## 2022-09-11 NOTE — Patient Instructions (Signed)
77 year old woman with Estrogen positive breast cancer:    Check CBC, CMP, and Hepatitis panel today.    Proceed with MRI and genetics evaluation as planned.    Will have clinical trials team evaluate her for I-SPY2 clinical trial. Will reach out to her with this information once available.    Will need a port prior to initiating treatment. Written information on Adriamycin, Cytoxan, and Paclitaxel given.     Will follow-up based on her clinical trial evaluation/patient's preference on treatment.

## 2022-09-11 NOTE — Progress Notes (Signed)
DIAGNOSIS & TREATMENT:   Left breast, invasive ductal carcinoma, Grade 2, ER positive, Low PR, HER 2 neu 2+ FISH nonamplified, clinical T2N1-2 - Stage IIB/IIIA (November - December 2023)    Right breast, invasive carcinoma, T2N1, ER negative, PR negative, HER 2 neu amplified, Stage II. 2011  - Lumpectomy and SLNBx  - Taxol + Herceptin (completed Herceptin for 1 year)      HISTORY OF PRESENT ILLNESS:   Ms. Montagnino is a very pleasant 77 year old woman with known history of HTN and DM-II who was initially diagnosed with breast cancer in 2011 (right sided). She underwent surgery and chemotherapy (along with anti-HER 2 therapy for a year) for her initial diagnosis. She had been under surveillance but noted an abnormal lump on her left sided which prompted a diagnostic study which was concerning. Given these findings, she underwent image guided biopsy which revealed invasive ductal carcinoma, ER positive, PR low, HER 2 neu 2+ FISH non-amplified. This tumor is different than the one in 2011. Importantly, her LN biopsy is positive and scans are concerning for 4 or more Lns involved. Given her findings, patient was evaluated by Dr. Lubertha [place] (though her initial work-up was in [place]). She came to [place] given her family ties here in this region (her daughter is a MFM fellow here with [employer] and her son-in-law is Systems developer with [employer]).     On today's visit, patient denies any episodes of fever, chills, nausea, or emesis. She remains active and independent for all her ADLs.       Past Medical History:   Diagnosis Date    Diabetes     High blood pressure      Past Surgical History:   Procedure Laterality Date    BREAST LUMPECTOMY Right      Family History   Problem Relation Age of Onset    Kidney cancer Brother      Current Outpatient Medications   Medication    blood glucose (ONETOUCH VERIO) test strip    semaglutide, 0.25 or 0.5 mg/dose, (OZEMPIC) pen    hydroCHLOROthiazide (HYDRODIURIL) 25 mg  tablet    amLODIPine (NORVASC) 5 mg tablet    potassium chloride (KLOR-CON) 20 MEQ packet    EPINEPHrine (EPIPEN) 0.3 mg/0.3 mL auto-injector    insulin glargine (LANTUS SOLOSTAR) 100 unit/mL injection pen     No current facility-administered medications for this visit.      Allergies   Allergen Reactions    Lisinopril Other (See Comments)     angioedema    Acetaminophen Hives    Eggs Or Egg-Derived Products Other (See Comments)     10.5.2020 Patient reports that she can tolerate the flu vaccine     Social History     Socioeconomic History    Marital status: Unknown   Tobacco Use    Smoking status: Never    Smokeless tobacco: Never       SOCIAL HISTORY: Denies any history of smoking or alcohol use.      REVIEW OF SYSTEMS:   12 point ROS is otherwise negative except what is stated above in the HPI      PHYSICAL EXAMINATION:  VITALS:  BP 146/78   Pulse 61   Temp 36.4 C (97.5 F) (Temporal)   Resp 17   Ht 159.2 cm (5' 2.68") Comment: lp  Wt 67.1 kg (147 lb 14.4 oz) Comment: lp  SpO2 97%   BMI 26.47 kg/m  Body mass index is 26.47  kg/m.    GENERAL: AO x 3, NAD  LUNGS: CTA B/L, no wheezing, ronchi, or crackles   HEART: Normal S1/S2, RRR   EXTREMITIES: No edema or cyanosis      PATHOLOGY:   A. Breast, left, 2 o'clock 6 cm from nipple (mass), core needle biopsy:    - Invasive ductal carcinoma with necrosis, grade 2 (tubule score 3,   nuclear score 2, mitotic score 1), see comment.     B. Breast, left, site 2, core needle biopsy:    - Invasive ductal carcinoma, grade 2 (tubule score 3, nuclear score 2,   mitotic score 1).     COMMENT: By report, immunohistochemical stains received for review are   estrogen receptor positive, progesterone receptor positive, HER2   equivocal and FISH "normal" ration HER2/CEP 17: 1.23. e-cadherin and   p120 stains support the morphologic impression of ductal histologic   type.       IMAGING: CT Chest, abdomen, and pelvis 07/28/22 in [place]  1. Left axillary adenopathy  measuring up to 1.7 cm in short axis,   with an additional 1.2 by 1.1 cm mass or lymph node along the left   lateral breast glandular tissue.   2. No findings of metastatic disease to the abdomen/pelvis or chest.   3. Large hiatal hernia containing stomach as well as part of the   pancreatic tail and splenic vein.   4. Prominent stool throughout the colon favors constipation.   5. Uterine fibroids.   6. Thoracolumbar scoliosis and spondylosis with degenerative disc   disease in the lumbar spine.   7. Aortic and coronary artery atherosclerosis. There is atheromatous   plaque at the origin of the celiac trunk and SMA dorsally without   occlusion.   8. Increased peripheral calcification along the right breast   lumpectomy site, likely therapy related.   9. Other imaging findings of potential clinical significance:   Coronary atherosclerosis. Simple and complex renal cysts,   surveillance in the context of the patient's underlying cancer   follow up imaging suggested. 2 mm left kidney lower pole   nonobstructive renal calculus.       LABORATORY DATA:       Lab results: 09/11/22  1259   WBC 3.5   Hemoglobin 12.7   Hematocrit 40   RBC 4.4   Platelets 281         Lab results: 09/11/22  1259   Sodium 140   Potassium 4.6   Chloride 99   CO2 29*   UN 20   Creatinine 0.64   Glucose 119*   Calcium 10.0   Total Protein 6.9   Albumin 4.6   ALT 19   AST 20   Alk Phos 110*   Bilirubin,Total 0.9         IMPRESSION/PLAN:   77 year old woman with history of breast cancer and now with contralateral disease:    Left breast, invasive ductal carcinoma, Grade 2, ER positive, Low PR, HER 2 neu 2+ FISH nonamplified, clinical T2N1-2 - Stage IIB/IIIA (November - December 2023): On today's visit, we had a chance to discuss her underlying disease and treatment options including the role of chemotherapy and neoadjuvant and adjuvant settings. I have shared the pros and cons of both approaches. Given concern of 4 or more LN involvement, I do think  upfront chemotherapy (neoadjuvant ddAC - Taxol) would be a preferred option if no contraindication. I have also discussed ongoing I-SPY2 trial and its  complex structure. Patient's biggest concern at the moment is the delay/not intervening since her diagnosis. I speculate with I-SPY 2, it could be another 3-6 weeks to get repeat biopsy and mammaprint/blueprint testing vs 2-3 weeks (for port, ECHO) to start SoC chemotherapy. Patient seems to be leaning towards starting SoC sooner than later, however, she will discuss this again with her family.  - Referral for IR port placement placed.  - Proceed with breast MRI as planned.  - Schedule an ECHO as soon as possible.  - Will plan to see her on Cycle 1, Day 1 of her treatment.  - Patient educated to seek medical attention with any new changes. Written paper work on ddAC-T were provided. We have also discussed side effects including but not limited to cardiac toxicity, hair loss, fatigue, and neuropathy. After chemo and surgery, she would potentially be a candidate for radiation. There after AI and perhaps Abemaciclib (after her final pathology) in adjuvant settings along with Zometa will be discussed.  - Patient and her family show good understanding of these options. Awaiting to confirm further plan from the patient/family.   - Continue to follow-up with PCP as scheduled for all other chronic medical needs.     1. Malignant neoplasm of upper-outer quadrant of left breast in female, estrogen receptor positive  CBC and differential    Comprehensive metabolic panel    Hepatitis C antibody    Hepatitis B surface antigen    Hepatitis B surf AB  (qual/quant)    Hepatitis B core antibody, total    IR port-a-cath (mediport) placement    Echo Complete          More than 60 minutes were spent today (on the date of the encounter) with greater than 50% of the time reviewing tests, discussing disease and it's management with the patient face-to-face.       Author: Opal Sidles, MD  09/11/2022 8:44 PM

## 2022-09-12 ENCOUNTER — Telehealth: Payer: Self-pay | Admitting: Internal Medicine

## 2022-09-12 NOTE — Patient Instructions (Addendum)
January 2024      Sunday Monday Tuesday Wednesday Thursday Friday Saturday        1     2    NEW PATIENT VISIT  10:00 AM   (60 min.)   Gooch, Jessica Charlotte, MD   [facility] 3     4    NEW PATIENT VISIT   9:00 AM   (60 min.)   Gosain, Rahul, MD   [facility]    LAB  10:00 AM   (30 min.)   Patric, Ryan   [facility] 5     6       7    RIS BI MR 60   2:00 PM   (45 min.)   RIS, RR MR1 (RRMR1)   [facility] [address] 8    NEW PATIENT VISIT   8:30 AM   (30 min.)   Kulp, Andrea, NP   [facility] 9     10     11    NEW PATIENT VISIT  11:00 AM   (60 min.)   Brennsteiner, Daniel   [facility]    LAB  12:00 PM   (30 min.)   CAN CTR, WBSTR HR POD C   [facility] 12     13       14     15     16    CV ECHOCARDIOGRAM   7:50 AM   (45 min.)   CARD, STRESS ECHO [place]   [facility] 17     18     19    IR PORT-A-CATH(MEDIPORT) PLACEMENT   9:15 AM   (60 min.)   RIS, RR IR1 (RRIR1)   [facility] [address]    Port draw/ Kriss chemo teach @ 2 PM 20       21     22  Gosain FU @ 11    AC @ 12   23     24     25     26     27       28     29     30     31                                February 2024      Sunday Monday Tuesday Wednesday Thursday Friday Saturday                       1     2     3       4     5  Port draw @ 8:30   6  Cima FU @ 8    AC @ 9 7     8     9     10       11     12     13     14     15     16     17       18     19   Port draw @ 8:30   20  Gosain FU @ 8    AC @ 9   21  22     23     24       25     26     27     28     05 December 2022      Sunday Monday Tuesday Wednesday Thursday Friday Saturday                            1     2       3     4    Port draw @ 8:30 5    Cima FU @ 8    AC @ 9 6    FOLLOW UP VISIT  10:40 AM   (20 min.)   MacLeod, Sara, MD   [facility] 7     8      9       10     11     12     13     14     15     16       17     18  Port draw @ 8:30   19  Gosain FU @ 9:30  1st paclitaxel @ 10:30   20     21     22     23       24     25    Port draw @ 8:30 26  Cima FU @ 8     Pac @ 9   27     28     29     30       31                                                Your [facility] Team:     Opal Sidles, MD- Medical Oncologist  Dani Gobble, NP- Nurse Practitioner  Ronnald Nian, RN- Clinic Nurse  Sampson Si- Social Worker    Phone: [phone] Please call this same number if you need to cancel or re-schedule your appointment.  Fax: [phone]     Please be prepared to let the secretary know why you are calling. This will help let the nurse know how urgent the call is.   We will return your call as soon as possible. On clinic days ( Monday/Tuesday all day, Wednesday mornings, Thursday afternoons), it may take the team longer to reach back. After hours, weekends, and holidays the phone is answered by the answering service. They will take a message and forward to the doctor on call who can access your chart and assist you.  Please allow 5 days to re-fill any prescriptions and 5-10 days to complete disability or FMLA paperwork.    For [facility] patient billing questions, please contact [phone] and your call will be directed to the appropriate person.

## 2022-09-14 ENCOUNTER — Ambulatory Visit
Admission: RE | Admit: 2022-09-14 | Discharge: 2022-09-14 | Disposition: A | Discharge status: Home / Self Care | Payer: Medicare (Managed Care) | Source: Ambulatory Visit | Attending: Surgical Oncology | Admitting: Surgical Oncology

## 2022-09-14 ENCOUNTER — Other Ambulatory Visit: Payer: Self-pay

## 2022-09-14 ENCOUNTER — Inpatient Hospital Stay: Admit: 2022-09-14 | Discharge: 2022-09-14 | Disposition: A | Discharge status: Home / Self Care | Payer: Self-pay

## 2022-09-14 DIAGNOSIS — C50412 Malignant neoplasm of upper-outer quadrant of left female breast: Secondary | ICD-10-CM

## 2022-09-14 DIAGNOSIS — C50912 Malignant neoplasm of unspecified site of left female breast: Secondary | ICD-10-CM | POA: Insufficient documentation

## 2022-09-14 DIAGNOSIS — C773 Secondary and unspecified malignant neoplasm of axilla and upper limb lymph nodes: Secondary | ICD-10-CM | POA: Insufficient documentation

## 2022-09-14 DIAGNOSIS — Z853 Personal history of malignant neoplasm of breast: Secondary | ICD-10-CM | POA: Insufficient documentation

## 2022-09-14 MED ORDER — GADOTERIDOL 279.3 MG/ML (PROHANCE) IV SOLN *I*
0.2000 mL/kg | Freq: Once | INTRAVENOUS | Status: AC
Start: 2022-09-14 — End: 2022-09-14
  Administered 2022-09-14: 13.7 mL via INTRAVENOUS

## 2022-09-15 ENCOUNTER — Encounter: Payer: Self-pay | Admitting: Orthopedic Surgery

## 2022-09-15 ENCOUNTER — Encounter: Payer: Self-pay | Admitting: Internal Medicine

## 2022-09-15 ENCOUNTER — Ambulatory Visit: Payer: Medicare (Managed Care) | Admitting: Orthopedic Surgery

## 2022-09-15 ENCOUNTER — Ambulatory Visit
Admission: RE | Admit: 2022-09-15 | Discharge: 2022-09-15 | Disposition: A | Discharge status: Home / Self Care | Payer: Medicare (Managed Care) | Source: Ambulatory Visit

## 2022-09-15 VITALS — Ht 62.5 in | Wt 150.0 lb

## 2022-09-15 DIAGNOSIS — M25561 Pain in right knee: Secondary | ICD-10-CM

## 2022-09-15 DIAGNOSIS — M19011 Primary osteoarthritis, right shoulder: Secondary | ICD-10-CM

## 2022-09-15 DIAGNOSIS — M419 Scoliosis, unspecified: Secondary | ICD-10-CM

## 2022-09-15 DIAGNOSIS — J439 Emphysema, unspecified: Secondary | ICD-10-CM

## 2022-09-15 DIAGNOSIS — M19012 Primary osteoarthritis, left shoulder: Secondary | ICD-10-CM

## 2022-09-15 NOTE — Plan of Care (Signed)
START ON PATHWAY REGIMEN - Breast    [id number]        Doxorubicin       Cyclophosphamide       Pegfilgrastim-xxxx       Paclitaxel     **Always confirm dose/schedule in your pharmacy ordering system**    Patient Characteristics:  Preoperative or Nonsurgical Candidate (Clinical Staging), Neoadjuvant Therapy followed by Surgery, Invasive Disease, Chemotherapy, HER2 Negative, ER Positive  Therapeutic Status: Preoperative or Nonsurgical Candidate (Clinical Staging)  AJCC M Category: cM0  AJCC Grade: G2  Breast Surgical Plan: Neoadjuvant Therapy followed by Surgery  ER Status: Positive (+)  AJCC 8 Stage Grouping: IIIA  HER2 Status: Equivocal  AJCC T Category: cT1c  AJCC N Category: cN2  PR Status: Negative (-)  Intent of Therapy:  Curative Intent, Discussed with Patient

## 2022-09-15 NOTE — Progress Notes (Signed)
Name: Christina [name]    Date Of Birth: Sep 12, 1945    eMRN: [id number]    Referring Physician: Donia Ast, MD  [id number]  [place],  [place] [address]       History of Present Illness:    Christina [name] is a 77 year old woman with a past medical history of essential tremor and breast cancer who presents with back pain in the setting of scoliosis.    Regarding the lumbar spine:    History:  The patient was diganosed with scoliosis 8 years ago and placed in a brace.  She has recently relocated to the [place] area.  She is here to establish care.    She is not having a lot of pain at this time.  She notes some discomfort.  She feels like she is tilting more.  She also has a history of right knee pain.  She has received 2 gel injections with decreasing efficacy.  She would like to see someone for her knee as well.    The patient takes motrin for pain, mostly for her knee pain.  She denies any fever or chills.  She denies any changes in her bowels or bladder.      Past Medical History:   Diagnosis Date    Diabetes     High blood pressure            Past Surgical History:   Procedure Laterality Date    BREAST LUMPECTOMY Right            Family History   Problem Relation Age of Onset    Kidney cancer Brother          Medications:    Current Outpatient Medications   Medication    blood glucose (ONETOUCH VERIO) test strip    semaglutide, 0.25 or 0.5 mg/dose, (OZEMPIC) pen    hydroCHLOROthiazide (HYDRODIURIL) 25 mg tablet    amLODIPine (NORVASC) 5 mg tablet    potassium chloride (KLOR-CON) 20 MEQ packet    EPINEPHrine (EPIPEN) 0.3 mg/0.3 mL auto-injector    insulin glargine (LANTUS SOLOSTAR) 100 unit/mL injection pen     No current facility-administered medications for this visit.         Allergies:    Allergies   Allergen Reactions    Lisinopril Other (See Comments)     angioedema    Acetaminophen Hives    Eggs Or Egg-Derived Products Other (See Comments)     10.5.2020 Patient reports that she can tolerate the  flu vaccine           Social History     Tobacco Use    Smoking status: Never    Smokeless tobacco: Never          Physical Examination:  Ht 1.588 m (5' 2.5")   Wt 68 kg (150 lb)   BMI 27.00 kg/m     GEN: well-appearing, NAD    MOTOR RIGHT:   shoulder abduction 5/5  shoulder adduction 5/5  external rotators 5/5  internal rotators 5/5  biceps 5/5  wrist extension 5/5  triceps 5/5  wrist flexion 5/5  finger extensors 5/5  finger abduction 5/5    Hip flexion 5/5  Knee extension 5/5  Knee flexion 5/5  Ankle dorsiflexion 5/5  Hallux extension 5/5  Ankle plantarflexion 5/5    MOTOR LEFT:  shoulder abduction 5/5  shoulder adduction 5/5  external rotators 5/5  internal rotators 5/5  biceps 5/5  wrist extension 5/5  triceps 5/5  wrist flexion 5/5  finger extensors 5/5  finger abduction 5/5      Hip flexion 5/5  Knee extension 5/5  Knee flexion 5/5  Ankle dorsiflexion 5/5  Hallux extension 5/5  Ankle plantarflexion 5/5    SENSATION: normal to light touch in the forearms, hand, and lower extremities.    REFLEXES: 2 reflexes in the biceps. There is 2 reflexia in the patellar reflexes and 2 in the achilles reflexes.     Hoffman Negative  Clonus Negative    Imaging Studies:    The images were personally reviewed by me in clinic.    I personally reviewed the scoliosis x-rays done today which showed, S shaped rotary thoracolumbar scoliosis.    Degenerative changes with degenerative cystic cystic geodes involving the right and left partially imaged glenohumeral joints and right femoral greater trochanteric and femoral head regions.  Increase sclerosis of the bilateral femoral heads suggesting osteonecrosis.  Multilevel degenerative changes throughout the thoracolumbar spine.      Impression/Plan:    Christina [name] is a 77 year old woman who presents with scoliosis and knee pain.     Plan:     Patient referred to Dr. Lilian Kapur for right knee pain.  2.  Continue motrin as directed for pain.  3.  Follow up with Kelli Churn NP in 2  months.  4.  Patient referred to physical therapy.

## 2022-09-15 NOTE — Progress Notes (Signed)
[facility] SOCIAL WORK:   [facility] Social Work Chief Executive Officer:     Patient referred based on distress screen?: Yes      Social Work contact made?: Yes      Method of contact:  MyChart    Social Work post assessment plan:  As needed follow-up  Referral:     Time spent on this encounter (in minutes):  10    Patient with recent high distress screen score.  I attempted to call pt at phone # [phone], but was unable to leave a message.  I sent pt a MyChart message introducing myself and requesting that she call me at her convenience.    SW available to assist as needed.

## 2022-09-15 NOTE — Addendum Note (Signed)
Addended by: Opal Sidles on: 09/15/2022 09:38 AM     Modules accepted: Orders

## 2022-09-17 ENCOUNTER — Telehealth: Payer: Self-pay | Admitting: Internal Medicine

## 2022-09-18 ENCOUNTER — Ambulatory Visit: Payer: Medicare (Managed Care)

## 2022-09-18 ENCOUNTER — Other Ambulatory Visit: Payer: Self-pay

## 2022-09-18 ENCOUNTER — Encounter: Payer: Self-pay | Admitting: Gastroenterology

## 2022-09-18 VITALS — BP 153/81 | HR 63 | Temp 97.2°F | Resp 18 | Wt 149.5 lb

## 2022-09-18 DIAGNOSIS — Z808 Family history of malignant neoplasm of other organs or systems: Secondary | ICD-10-CM

## 2022-09-18 DIAGNOSIS — C50912 Malignant neoplasm of unspecified site of left female breast: Secondary | ICD-10-CM

## 2022-09-18 DIAGNOSIS — Z8051 Family history of malignant neoplasm of kidney: Secondary | ICD-10-CM

## 2022-09-18 DIAGNOSIS — Z853 Personal history of malignant neoplasm of breast: Secondary | ICD-10-CM

## 2022-09-18 DIAGNOSIS — Z7183 Encounter for nonprocreative genetic counseling: Secondary | ICD-10-CM

## 2022-09-18 DIAGNOSIS — Z8042 Family history of malignant neoplasm of prostate: Secondary | ICD-10-CM

## 2022-09-18 NOTE — Progress Notes (Addendum)
PATIENT INFORMATION/HPI: Christina [name] is a 77 y.o. female who presents to the [facility] Hereditary Cancer Screening and Risk Reduction Program for hereditary cancer risk assessment. The patient attended the session alone. She called her daughter Kerby Less while we were both in the room together to confirm that she had no genetic testing done in the past.     The patient was counseled and evaluated by me for genetic testing on 09/18/22. She is interested in genetic testing due to a significant family and personal history of two types of Breast Cancers being diagnosed, one currently treated and the other back in 2011.     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     Possible radiation treatment post chemo/surgery.      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 Next Due 2026. Phx x1 polyp removed from first colonoscopy per patient.  Skin Cancer: Never saw Dermatology - no concerns per patient.     Relevant Surgical Hx: R Breast lumpectomy 2011    Risk Factors:  Smoking: Never  Alcohol: x2 drinks / month         Family History of Cancer:    A three generation pedigree was obtained at this visit. All family history was reported by the patient; no pathology reports or records were available for review to confirm diagnoses.    Paternal Ancestry: [place]   Maternal Ancestry: [place]   Ashkenazi Jewish ancestry:  None  Consanguinity: No    Immediate Family Hx:     Relationship to patient Cancer/Tumor Type Age at Dx Status    Brother   Kidney Cancer - unspecified    42   Alive at 81    "Likely not interested in genetic testing."     Mother   Skin Cancer - possibly Melanoma   <50yo Deceased at 52   Nephew Prostate Cancer 45 Alive at 45yo    Had negative genetic testing, though unable to confirm or see details of the test.       Other Living Relatives: Daughter 37 yo - already visited the Hered[facility] Cancer Clinic at [facility], considered high-risk for breast cancer and did not have genetic testing; Granddaughter 44mo      Maternal Family Hx: limited information    Relationship to patient Cancer/Tumor Type Age at Dx Status    N/A            Other Living Relatives (Second Degree): Half-Uncle 89      Paternal Family Hx: limited information    Relationship to patient Cancer/Tumor Type Age at Dx Status    N/A            Other Living  Relatives (Second Degree): None       ASSESSMENT:      Raysha Provencher presents to the [facility] Hereditary Cancer Screening and Risk Reduction Program for hereditary cancer risk assessment due to a significant personal history of Breast Cancer and a family history of cancer.    I discussed with the patient the etiologies of cancer and hereditary cancers. We reviewed that the vast majority of cancers have a sporadic etiology. These cancers are caused by somatic mutations leading to abnormal cell proliferation and stem from chance events or environmental insults. A smaller proportion of cancers can be attributed to inherited gene mutations that increase an individual's chances to develop cancer within their lifetime. The percentage of cancers caused by hereditary predispositions does vary by tissue type, but overall only about 5-10% of cancers are hereditary, with the rest being sporadic (~65%) or familial (~25-30%). We discussed that red flags for hereditary cancers include an early age of onset,  having a relatively rare cancer such as ovarian cancer, multiple primary cancer diagnoses in one individual, and multiple diagnoses of the same or associated cancers seen on the same side of the family.      We discussed that most hereditary cancer predispositions are inherited in an autosomal dominant manner, which means that if the patient were to test positive for a hereditary predisposition, any children of the patient would have a 50% chance of having inherited the predisposition. Additionally, there is about a 50% chance that the mutated gene could have been inherited by the father and about a 50% chance that it was instead inherited from the mother. Alternatively, there are a few genes associated with cancer that may be inherited in a de novo fashion, which means that the mutation was not inherited from either the father or the mother but was instead a new mutation; in this case, there is a very small risk of any siblings having the same mutation as the patient, but each child of the patient will still have a 50% chance of inheriting it.     We discussed the possible results of genetic testing. A positive result indicates that a risk-associated mutation has been found in one of the genes. We discussed that if the patient were to have a positive result, this could change current cancer treatment recommendations and also may mean more enhanced screenings being recommended for the patient in the future. Depending on the type of cancers associated with the mutated gene, the patient may also have the option for risk-reducing surgeries or chemoprevention.If the patient has a positive result, we will go over who else in the family should consider genetic testing. A negative result indicates that no known disease-causing mutation has been detected at this time. This could mean that: 1) the patient's cancer dx is not due to a hereditary predisposition to cancer 2) the cancer is not due to a currently identified  inherited susceptibility 3) the cancer was caused by a change in a gene that was not analyzed or 4) there is a mutation in one of the analyzed genes, but the laboratory could not identify it with the current technology. The third possible result is a variant of uncertain significance (VUS), or a gene change for which data determining pathogenicity is insufficient. We discussed that a VUS is treated as a negative result as most are ultimately reclassified as benign changes. No actions would be recommended based on this result, and we would not advise other family members to pursue  testing for this VUS.     We discussed panel testing of different sizes, and that choosing a bigger gene panel increases the risk of finding an inherited predisposition, but also increases the risk of finding a VUS. We discussed the risks associated with genetic testing, including the emotional burden that the patient may feel. Genetic testing results can cause fear, anxiety, and sometimes guilt (especially if the patient has a child that may have inherited the predisposition). Another risk associated with genetic testing is the possibility of being discriminated against when a predisposition to cancer is detected. [employer] is a Freight forwarder that protects individuals from being discriminated against by employers or by health insurance due to their genetic testing results. There is no such law preventing life insurance, disability insurance, or long-term care insurance from discriminating against a person based on their genetic testing results. That said, each of these insurances are more likely to use this patient's personal diagnosis of cancer to deny the patient coverage or raise their premiums than they are to use the results from this genetic testing.     We discussed insurance coverage for genetic testing. I explained to the patient that she meets NCCN criteria for genetic testing, and that therefore the insurance should cover the test.  However, we cannot guarantee that the insurance company will do so. We discussed that in the event the patient's testing is not covered by insurance or the patient has a high deductible plan, the genetic testing company will call the patient to offer them an out of pocket price of $250. The patient is responsible for responding to inquiries from the genetic testing company regarding insurance versus self-pay, as the company may default to insurance if they do not hear back from the patient. We discussed that the testing company should be contacted directly for any billing questions related to the genetic testing.    The patient ultimately decided to go ahead with [facility]. She was mostly concerned about breast cancers and her nephew's prostate cancer, rather than the kidney cancer. For [facility], the sample is obtained with two cheek swabs, which was collected and submitted today. We discussed results would be analyzed and available in approximately 4 weeks.    [facility] Common Hereditary Cancers Panel (48 cancer genes) via buccal sample, collected today:  APC, ATM, AXIN2, BAP1, BARD1, BMPR1A, BRCA1, BRCA2, BRIP1, CDH1, CDK4, CDKN2A, CHEK2, CTNNA1, DICER1, EPCAM, FH, GREM1, HOXB13, KIT, MBD4, MEN1, MLH1, MSH2, MSH3, MSH6, MUTYH, NF1, NTHL1, PALB2, PDGFRA, PMS2, POLD1, POLE, PTEN, RAD51C, RAD51D, SDHA, SDHB, SDHC, SDHD, SMAD4, SMARCA4, STK11, TP53, TSC1, TSC2, VHL    For this patient, the genetic test results are needed in order to consider medical management strategies including current cancer treatment, breast surveillance, colonoscopy, upper endoscopy, surveillance for endometrial cancer, imaging, prophylactic surgeries, and chemoprevention strategies.  These results are also needed to inform relatives of their potential risk for cancer.      Based on the personal/family history provided today, the patient meets NCCN/ASBrS criteria for genetic tesitng.       PLAN:      Due to the patient's personal diagnosis  of current IDC of the Left Breast and her personal history of a different Breast Cancer type in the Right Breast, as well as the significant family history of Prostate Cancer, possible Melanoma, and the fact that results could affect her daughter and her being currently considered high-risk for Breast Cancer, genetic testing was ordered: [facility] Common Hereditary Cancers Panel (48 genes) via  buccal sample, collected today.      The patient would like to be called with the results of testing. She is comfortable with a telephone call for disclosure. We discussed that if the results are positive (a germline pathogenic variant is identified), the patient will be encouraged to return to the office for follow-up on results to discuss implications for current diagnosis, screening/risk-reduction strategies for future cancer risks, and we will also talk about cascade testing of relatives (if applicable).     The patient was encouraged to call the hereditary clinic office at any time if problems or questions arise.  She had the opportunity to ask questions. Every question that she asked was answered to her satisfaction. The patient is in agreement with the plan going forward.      45 minutes was spent on the calendar day of the encounter reviewing the EMR and management of this patient. This time was also spent with the patient reviewing cancer genetics, cancer risk, cancer screening, and risk reduction.  More than 50% of the time with the patient was spent counseling the patient about coordination of care and emotional support related to genetic testing.       Marni Griffon, MS, MPH   Genetic Counselor   Hereditary Cancer Program  [facility]

## 2022-09-19 ENCOUNTER — Telehealth: Payer: Self-pay

## 2022-09-19 NOTE — Telephone Encounter (Signed)
Patient's daughter is calling to cancel 1/16 echo as patient had echo completed elsewhere.

## 2022-09-23 ENCOUNTER — Other Ambulatory Visit: Payer: Medicare (Managed Care)

## 2022-09-24 ENCOUNTER — Inpatient Hospital Stay: Admit: 2022-09-24 | Discharge: 2022-09-24 | Disposition: A | Discharge status: Home / Self Care | Payer: Self-pay

## 2022-09-26 ENCOUNTER — Other Ambulatory Visit: Payer: Medicare (Managed Care)

## 2022-09-26 ENCOUNTER — Ambulatory Visit: Payer: Medicare (Managed Care)

## 2022-09-29 ENCOUNTER — Ambulatory Visit: Payer: Medicare (Managed Care)

## 2022-10-07 LAB — UNMAPPED LAB RESULTS
Basophil # (HT): 0 10 3/uL (ref 0.0–0.2)
Basophil % (HT): 1 % (ref 0–3)
Eosinophil # (HT): 0.1 10 3/uL (ref 0.0–0.6)
Eosinophil % (HT): 2 % (ref 0–5)
Hematocrit (HT): 39 % (ref 35–47)
Hemoglobin (HGB) (HT): 12.4 g/dL (ref 12.0–16.0)
Lymphocyte # (HT): 1.2 10 3/uL (ref 1.0–4.8)
Lymphocyte % (HT): 33 % (ref 15–45)
MCHC (HT): 32 g/dL (ref 31.0–37.5)
MCV (HT): 88 fL (ref 80–100)
Mean Corpuscular Hemoglobin (MCH) (HT): 28 pg (ref 26.0–34.0)
Monocyte # (HT): 0.3 10 3/uL (ref 0.1–1.0)
Monocyte % (HT): 8 % (ref 0–15)
Neutrophil # (HT): 2.1 10 3/uL (ref 1.8–8.0)
Platelets (HT): 277 10 3/uL (ref 150–450)
RBC (HT): 4.43 10 6/uL (ref 3.80–5.20)
RDW (HT): 12.4 % (ref 0.0–15.2)
Seg Neut % (HT): 56 % (ref 45–75)
WBC (HT): 3.7 10 3/uL — ABNORMAL LOW (ref 4.0–11.0)

## 2022-10-13 ENCOUNTER — Ambulatory Visit: Payer: Medicare (Managed Care)

## 2022-10-14 ENCOUNTER — Ambulatory Visit: Payer: Medicare (Managed Care)

## 2022-10-21 LAB — UNMAPPED LAB RESULTS
Basophil # (HT): 0 10 3/uL (ref 0.0–0.2)
Basophil % (HT): 0 % (ref 0–3)
Eosinophil # (HT): 0 10 3/uL (ref 0.0–0.6)
Eosinophil % (HT): 0 % (ref 0–5)
Hematocrit (HT): 34 % — ABNORMAL LOW (ref 35–47)
Hemoglobin (HGB) (HT): 10.8 g/dL — ABNORMAL LOW (ref 12.0–16.0)
Lymphocyte # (HT): 0.8 10 3/uL — ABNORMAL LOW (ref 1.0–4.8)
Lymphocyte % (HT): 10 % — ABNORMAL LOW (ref 15–45)
MCHC (HT): 32 g/dL (ref 31.0–37.5)
MCV (HT): 89 fL (ref 80–100)
Mean Corpuscular Hemoglobin (MCH) (HT): 28.3 pg (ref 26.0–34.0)
Monocyte # (HT): 0.3 10 3/uL (ref 0.1–1.0)
Monocyte % (HT): 4 % (ref 0–15)
Neutrophil # (HT): 6.8 10 3/uL (ref 1.8–8.0)
Platelets (HT): 167 10 3/uL (ref 150–450)
RBC (HT): 3.81 10 6/uL (ref 3.80–5.20)
RDW (HT): 12.9 % (ref 0.0–15.2)
Seg Neut % (HT): 81 % — ABNORMAL HIGH (ref 45–75)
WBC (HT): 8.3 10 3/uL (ref 4.0–11.0)

## 2022-10-23 ENCOUNTER — Telehealth: Payer: Self-pay

## 2022-10-23 ENCOUNTER — Encounter: Payer: Self-pay | Admitting: Obstetrics and Gynecology

## 2022-10-23 NOTE — Telephone Encounter (Signed)
Returning phone call about patient's genetic test result. Her test result has been disclosed to the patient this afternoon with a detailed medical note in her chart, under my telephone encounter with the patient today.    Marni Griffon, MS, MPH   Genetic Counselor   Hereditary Cancer Program  [facility]

## 2022-10-23 NOTE — Telephone Encounter (Signed)
PATIENT IDENTIFICATION: Christina [name] 77 y.o. was seen in the [facility] on 09/18/2022. We decided at that appointment together that we would review genetic testing results over the phone.       Genetic testing was done with [employer]. The test results will be scanned into the media section of the patient's chart for provider accessibility.     ASSESSMENT/RESULTS:    Christina [name] 77 y.o. was seen in the [facility]red[facility] Cancer Scre[facility] Risk Reduction Clinic and called to review results from genetic testing for a significant personal and family history of cancer.     Genetic testing was done with [employer] Common Hereditary Cancers Panel. This is a 48 gene panel that includes the following genes: APC, ATM, AXIN2, BAP1, BARD1, BMPR1A, BRCA1, BRCA2, BRIP1, CDH1, CDK4, CDKN2A, CHEK2, CTNNA1, DICER1, EPCAM, FH, GREM1, HOXB13, KIT, MBD4, MEN1, MLH1, MSH2, MSH3, MSH6, MUTYH, NF1, NTHL1, PALB2, PDGFRA, PMS2, POLD1, POLE, PTEN, RAD51C, RAD51D, SDHA, SDHB, SDHC, SDHD, SMAD4, SMARCA4, STK11, TP53, TSC1, TSC2, VHL    The results of the testing was negative for any pathogenic variants. An inherited cancer syndrome was not detected. There was no genetic explanation for the patient's personal/family history of cancer detected. No additional genetic testing is indicated for Christina [name]. There is no additional genetic testing recommended for the patient's daughter based on this result. There is always a chance that a pathogenic variant exists in the family that Christina [name] did not inherit, and the patient's Brother would also qualify for testing.     The laboratory highlights variants that are present but do not require clinical action. A variant of uncertain signficance, which is not clinically actionable, was identified within this patient. There is currently insufficient data to determine if this variant causes an increased cancer risk. Should the laboratory determine  that this variant becomes clinically actionable, an amended report will be generated. 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 Pathogenic mutations in the gene APC are associated with the disorder Familial Adenomatous Polyposis (FAP) and Attenuated Familial Adenomatous Polyposis (AFAP). FAP is characterized by hundreds to thousands of adenomatous colonic polyps which develop at the average age of 16 years, whereas people with AFAP typically have fewer than 100 (with an average of ~30). Without a colectomy, colorectal cancer is inevitable in patients with FAP; the average age of diagnosis for this cancer in affected individuals is 39 years. FAP also involves predispositions to other cancers (including papillary thyroid carcinoma, gastric cancer and cancers of the duodenum) and extracolonic manifestations (including multifocal/bilateral CHRPE, desmoid tumors, osteomas, polyps of the gastric fundus and duodenum, dental abnormalities, and more). Cancer risks and medical management should be based on the patient's personal and family history of cancer. Christina [name] has a history of only one colon polyp and is due for another colonoscopy in 2026. This uncertain result is not likely to explain her personal nor family history of cancer.     One way to help researchers and laboratories improve how they classify variants of uncertain significance is to participate in research. Please consider self-enrolling in the PROMPT study. Https://promptstudy.info/     The PROMPT study is a registry where you can go online to the above link and enroll and include information about the variant found in your testing as well as your personal and family history. The goal of this study is to be able to better classify these variants of uncertain significance. Once you have  a copy of your genetic testing results I recommend you considering enrolling by taking a look at the above link, if you are interested.       PLAN:     The patient was notified to continue follow up with her oncology team for management of cancer. The patient was advised to continue maintaining routine screenings as indicated per routine guidelines and per primary care provider. The patient was also advised to notify her family of family history of cancer to ensure they are aware of this history so they are screened for cancer appropriately.     The patient was encouraged to call the office any time if problems or questions arise. She had the opportunity to ask questions.  All her questions were answered to her satisfaction. She is agreeable with this plan. A copy of the genetic testing results will be scanned into the media section of the patient's chart. The patient will also be mailed a copy of their genetic testing report.      I personally spent 10 minutes on the calendar day of the encounter, including pre and post visit work.    Marni Griffon, MS, MPH   Genetic Counselor   Hereditary Cancer Program  [facility]

## 2022-10-23 NOTE — Telephone Encounter (Signed)
Jessica RN from [facility] is calling to see if genetic testing results were in. Stated that pt had yet to hear anything and was wondering if anything else was needed in order to complete. Requesting call back. Number Verified

## 2022-10-24 NOTE — Telephone Encounter (Deleted)
error 

## 2022-10-24 NOTE — Telephone Encounter (Signed)
ERROR

## 2022-10-27 ENCOUNTER — Ambulatory Visit: Payer: Medicare (Managed Care)

## 2022-10-28 ENCOUNTER — Ambulatory Visit: Payer: Medicare (Managed Care)

## 2022-10-31 ENCOUNTER — Other Ambulatory Visit: Payer: Self-pay

## 2022-10-31 ENCOUNTER — Ambulatory Visit
Admission: RE | Admit: 2022-10-31 | Discharge: 2022-10-31 | Disposition: A | Discharge status: Home / Self Care | Payer: Medicare (Managed Care) | Source: Ambulatory Visit | Admitting: Radiology

## 2022-10-31 ENCOUNTER — Ambulatory Visit: Payer: Medicare (Managed Care) | Attending: Physician Assistant | Admitting: Physician Assistant

## 2022-10-31 VITALS — BP 161/72 | HR 77 | Temp 97.9°F | Resp 18

## 2022-10-31 DIAGNOSIS — R509 Fever, unspecified: Secondary | ICD-10-CM

## 2022-10-31 DIAGNOSIS — K449 Diaphragmatic hernia without obstruction or gangrene: Secondary | ICD-10-CM

## 2022-10-31 DIAGNOSIS — Z1152 Encounter for screening for COVID-19: Secondary | ICD-10-CM | POA: Insufficient documentation

## 2022-10-31 DIAGNOSIS — Z20822 Contact with and (suspected) exposure to covid-19: Secondary | ICD-10-CM | POA: Insufficient documentation

## 2022-10-31 DIAGNOSIS — J069 Acute upper respiratory infection, unspecified: Secondary | ICD-10-CM

## 2022-10-31 DIAGNOSIS — Z20828 Contact with and (suspected) exposure to other viral communicable diseases: Secondary | ICD-10-CM | POA: Insufficient documentation

## 2022-10-31 DIAGNOSIS — R059 Cough, unspecified: Secondary | ICD-10-CM

## 2022-10-31 MED ORDER — BENZONATATE 200 MG PO CAPS *I*
200.0000 mg | ORAL_CAPSULE | Freq: Three times a day (TID) | ORAL | 0 refills | Status: AC | PRN
Start: 2022-10-31 — End: ?

## 2022-10-31 NOTE — UC Provider Note (Signed)
Chief Complaint:   Chief Complaint   Patient presents with    Cough     Pt reports non-productive cough for 2 days.  Temp was 100.3 today, she called hematologist who recommended chest x ray.   Denies any other symptoms - no sore throat, no ear pain, no HA, no body aches.         HPI:  77 y.o. female w hx as noted in chart presents to Urgent Care for URI symptoms x2 days.     Symptoms are progressively worsened    Symptoms include 2 days of nonproductive, dry coughing, low-grade fever today of 100.3 F this afternoon, no medication taken.  She denies any chest pain, shortness of breath, nausea, vomiting, diarrhea, sore throat, pain with swallowing, ear pain, headache, lightheadedness, dizziness, or syncope.  Patient has prior history of left-sided breast malignancy, no prior history of lung metastasis.  Patient was advised by her hematologist to get evaluated for chest x-ray to evaluate for pneumonia with low-grade fever today.      COVID exposure / other sick contacts: no  COVID 19 positive in the last 3 months: no  Fully vaccinated for COVID 19: yes    VITALS:  BP 161/72   Pulse 77   Temp 36.6 C (97.9 F) (Temporal)   Resp 18   SpO2 96%     ROS: see HPI above      PHYSICAL EXAM:  Appearance: Well appearing, no acute distress. Patient is awake, alert, calm, and conversing with provider in complete sentences without difficulty.  Eyes: no conjunctival injection or drainage   Ears: Normal TMs and canals bilaterally   Nose: Mild nasal congestion, frontal and maxillary sinuses non tender to palpation   Mouth/Throat: mild erythema of oropharynx, no petechiae or exudate, no PTA   Neck: No cervical LAD, no tenderness, full AROM   CV: RRR, no murmur   Pulm: no acute respiratory distress, Lungs clear to auscultation bilaterally, mild coarse scattered rhonchi along right lung base, no wheeze or rales.  No increased work of breathing.  Skin: no pallor or rash     MEDICAL DECISION MAKING:  Assessment: 77 y.o. female w hx  as noted in chart presents to UC with dry cough, low-grade fever today of 100.3 F, no chest pain or shortness of breath.  Patient is afebrile with stable vital signs here at urgent care, no acute distress or toxicity..    Differential Diagnosis: Viral URI w cough, Bronchitis, Acute nasopharyngitis, postnasal drip, sinusitis, community-acquired pneumonia, atypical pneumonia, COVID 5, RSV, influenza    Plan and Results:     Orders Placed This Encounter    COVID/Influenza A & B/RSV NAAT (PCR)    *Chest standard frontal and lateral views    benzonatate (TESSALON) 200 mg capsule     *Chest standard frontal and lateral views    Result Date: 10/31/2022  10/31/2022 8:07 PM CHEST X-RAYS CLINICAL INFORMATION:  Nonproductive cough x 2 days, fever 100.3 ???F today, evaluate for pneumonia or acute cardiopulmonary disease, J06.9-Acute upper respiratory infection, unspecifiedR50.9-Fever, unspecified. COMPARISON:  None. PROCEDURE: Frontal and lateral projections of the chest were obtained. FINDINGS: Tubes and Catheters: Right indwelling central line tip is in the SVC. Central Airways: Normal. Lungs: Normal. Pleura/Pleural space: Normal. Heart and Mediastinum: Large retrocardiac hiatal hernia.. Additional Findings: No acute or aggressive osseous changes noted. Postsurgical changes within right breast lumpectomy. Small clips noted within the left breast and left axilla..     No acute cardiopulmonary disease.  Moderate large retrocardiac hiatal hernia. END OF IMPRESSION       [facility] submits this DICOM format image data and final report to the [facility], an independent secure electronic health information exchange, on a reciprocally searchable basis (with patient authorization) for a minimum of 12 months after exam date.    X-ray imaging and results personally reviewed with patient at bedside.    I discussed that if the patient's influenza or COVID-19 test comes back positive, treatment options with antivirals exist. Shared  decision making with patient initiated, patient may be interested.         Diagnosis and Disposition:  1. Viral URI with cough    2. Low grade fever        Patient Instructions   Warm salt water gargles and nasal saline rinses will help improve your symptoms.  Continue Tylenol 650 mg every 4-6 hours as needed for pain/fever.  Maximum daily dose = 3000 mg.  Please continue use of over-the-counter Mucinex DM 600 mg-30 mg twice daily x 5 days for mucus/congestion/cough relief.  You may also purchase and utilize OTC Vicks vapor rub to apply to your chest as directed for cough relief.  Continue use of home humidifier overnight in your room.  You need to rest from strenuous or exertional/exacerbating activities for 5 days at minimum.    Please continue to hydrate aggressively, and get plenty of rest.  Please stay on the mandatory self-isolation temporary quarantine until you are no longer having fevers, no longer symptomatic, and your COVID-19/RSV/influenza testing has finalized.  Please continue to wear a respiratory droplet facial mask to prevent spread of viral disease.  Continue to wash hands frequently.    If you develop any worsening new or returning fevers that are not well treated with fever medication, new pain with breathing, difficulty breathing, shortness of breath, lightheadedness, dizziness, fainting episodes, worsening overall chest pain, coughing or coughing up blood, or any evolution to your symptoms as they are presenting today, you need to call 911 or be transferred immediately to your nearest emergency department for further evaluation and treatment.        Encourage fluids, rest, & good hand hygiene.    Use over the counter medications as discussed.    Please start the new medications as below:        Please follow up with your physician as below:    Follow up in about 5 days (around 11/05/2022) for with your Primary Care Provider, Follow-up.    Given the patient's reassuring vital signs and overall  well appearance, the patient can be managed safely at home. Patient advised to call PCP or return to urgent care for any worsening or concerning symptoms as reviewed and provided on AVS.    Supervising physician, Astrid Drafts, DO, was immediately available.    [employer] and education officer was used for part/all of this encounter. Errors in grammar were changed and fixed to the best of my ability.

## 2022-10-31 NOTE — Patient Instructions (Addendum)
Warm salt water gargles and nasal saline rinses will help improve your symptoms.  Continue Tylenol 650 mg every 4-6 hours as needed for pain/fever.  Maximum daily dose = 3000 mg.  Please continue use of over-the-counter Mucinex DM 600 mg-30 mg twice daily x 5 days for mucus/congestion/cough relief.  You may also purchase and utilize OTC Vicks vapor rub to apply to your chest as directed for cough relief.  Continue use of home humidifier overnight in your room.  You need to rest from strenuous or exertional/exacerbating activities for 5 days at minimum.    Please continue to hydrate aggressively, and get plenty of rest.  Please stay on the mandatory self-isolation temporary quarantine until you are no longer having fevers, no longer symptomatic, and your COVID-19/RSV/influenza testing has finalized.  Please continue to wear a respiratory droplet facial mask to prevent spread of viral disease.  Continue to wash hands frequently.    If you develop any worsening new or returning fevers that are not well treated with fever medication, new pain with breathing, difficulty breathing, shortness of breath, lightheadedness, dizziness, fainting episodes, worsening overall chest pain, coughing or coughing up blood, or any evolution to your symptoms as they are presenting today, you need to call 911 or be transferred immediately to your nearest emergency department for further evaluation and treatment.

## 2022-11-01 LAB — COVID/INFLUENZA A & B/RSV NAAT (PCR)
COVID-19 NAAT (PCR): NEGATIVE
Influenza A NAAT (PCR): NEGATIVE
Influenza B NAAT (PCR): NEGATIVE
RSV NAAT (PCR): NEGATIVE

## 2022-11-04 LAB — UNMAPPED LAB RESULTS
Basophil # (HT): 0 10 3/uL (ref 0.0–0.2)
Basophil % (HT): 1 % (ref 0–3)
Eosinophil # (HT): 0 10 3/uL (ref 0.0–0.6)
Eosinophil % (HT): 0 % (ref 0–5)
Hematocrit (HT): 34 % — ABNORMAL LOW (ref 35–47)
Hemoglobin (HGB) (HT): 11 g/dL — ABNORMAL LOW (ref 12.0–16.0)
Lymphocyte # (HT): 0.6 10 3/uL — ABNORMAL LOW (ref 1.0–4.8)
Lymphocyte % (HT): 7 % — ABNORMAL LOW (ref 15–45)
MCHC (HT): 32.6 g/dL (ref 31.0–37.5)
MCV (HT): 87 fL (ref 80–100)
Mean Corpuscular Hemoglobin (MCH) (HT): 28.4 pg (ref 26.0–34.0)
Monocyte # (HT): 0.8 10 3/uL (ref 0.1–1.0)
Monocyte % (HT): 10 % (ref 0–15)
Neutrophil # (HT): 6.6 10 3/uL (ref 1.8–8.0)
Platelets (HT): 188 10 3/uL (ref 150–450)
RBC (HT): 3.87 10 6/uL (ref 3.80–5.20)
RDW (HT): 13.8 % (ref 0.0–15.2)
Seg Neut % (HT): 78 % — ABNORMAL HIGH (ref 45–75)
WBC (HT): 8.5 10 3/uL (ref 4.0–11.0)

## 2022-11-10 ENCOUNTER — Ambulatory Visit: Payer: Medicare (Managed Care)

## 2022-11-11 ENCOUNTER — Ambulatory Visit: Payer: Medicare (Managed Care) | Admitting: Internal Medicine

## 2022-11-11 ENCOUNTER — Ambulatory Visit: Payer: Medicare (Managed Care)

## 2022-11-12 ENCOUNTER — Ambulatory Visit: Payer: Medicare (Managed Care) | Admitting: Internal Medicine

## 2022-11-18 LAB — UNMAPPED LAB RESULTS
Basophil # (HT): 0 10 3/uL (ref 0.0–0.2)
Basophil % (HT): 0 % (ref 0–3)
Eosinophil # (HT): 0.1 10 3/uL (ref 0.0–0.6)
Eosinophil % (HT): 1 % (ref 0–5)
Hematocrit (HT): 26 % — ABNORMAL LOW (ref 35–47)
Hemoglobin (HGB) (HT): 8.4 g/dL — ABNORMAL LOW (ref 12.0–16.0)
Lymphocyte # (HT): 0.4 10 3/uL — ABNORMAL LOW (ref 1.0–4.8)
Lymphocyte % (HT): 5 % — ABNORMAL LOW (ref 15–45)
MCHC (HT): 32.6 g/dL (ref 31.0–37.5)
MCV (HT): 90 fL (ref 80–100)
Mean Corpuscular Hemoglobin (MCH) (HT): 29.2 pg (ref 26.0–34.0)
Monocyte # (HT): 0.7 10 3/uL (ref 0.1–1.0)
Monocyte % (HT): 9 % (ref 0–15)
Neutrophil # (HT): 6.3 10 3/uL (ref 1.8–8.0)
Platelets (HT): 256 10 3/uL (ref 150–450)
RBC (HT): 2.88 10 6/uL — ABNORMAL LOW (ref 3.80–5.20)
RDW (HT): 14.5 % (ref 0.0–15.2)
Seg Neut % (HT): 75 % (ref 45–75)
WBC (HT): 8 10 3/uL (ref 4.0–11.0)

## 2022-11-19 ENCOUNTER — Ambulatory Visit: Payer: Medicare (Managed Care) | Admitting: Sports Medicine

## 2022-11-24 ENCOUNTER — Ambulatory Visit: Payer: Medicare (Managed Care)

## 2022-11-25 ENCOUNTER — Ambulatory Visit: Payer: Medicare (Managed Care)

## 2022-11-25 ENCOUNTER — Other Ambulatory Visit: Payer: Self-pay

## 2022-11-25 ENCOUNTER — Ambulatory Visit: Payer: Medicare (Managed Care) | Attending: Sports Medicine | Admitting: Sports Medicine

## 2022-11-25 ENCOUNTER — Encounter: Payer: Self-pay | Admitting: Sports Medicine

## 2022-11-25 ENCOUNTER — Ambulatory Visit
Admission: RE | Admit: 2022-11-25 | Discharge: 2022-11-25 | Disposition: A | Discharge status: Home / Self Care | Payer: Medicare (Managed Care) | Source: Ambulatory Visit

## 2022-11-25 VITALS — BP 139/65 | HR 65 | Ht 62.5 in | Wt 149.0 lb

## 2022-11-25 DIAGNOSIS — M25761 Osteophyte, right knee: Secondary | ICD-10-CM | POA: Insufficient documentation

## 2022-11-25 DIAGNOSIS — M76891 Other specified enthesopathies of right lower limb, excluding foot: Secondary | ICD-10-CM | POA: Insufficient documentation

## 2022-11-25 DIAGNOSIS — M1711 Unilateral primary osteoarthritis, right knee: Secondary | ICD-10-CM | POA: Insufficient documentation

## 2022-11-25 DIAGNOSIS — M25561 Pain in right knee: Secondary | ICD-10-CM | POA: Insufficient documentation

## 2022-11-25 DIAGNOSIS — M25861 Other specified joint disorders, right knee: Secondary | ICD-10-CM

## 2022-11-25 NOTE — Progress Notes (Deleted)
Chief Complaint   Patient presents with    Right Knee - New Patient Visit       History:  Christina [name] is a 77 y.o. female who presents today with *** knee pain.            Review of Systems:   No fevers, chills, sweats  No paresthesias/weakness       Pain    11/25/22 1037   PainSc:   4   PainLoc: Knee       Past medical history, past surgical history, medications, allergies, family history, social history, and review of systems were reviewed today and have been documented separately in this encounter.     Past Medical History:   Diagnosis Date    Diabetes     High blood pressure          Past Surgical History:   Procedure Laterality Date    BREAST LUMPECTOMY Right        Current Outpatient Medications   Medication    benzonatate (TESSALON) 200 mg capsule    blood glucose (ONETOUCH VERIO) test strip    semaglutide, 0.25 or 0.5 mg/dose, (OZEMPIC) pen    amLODIPine (NORVASC) 5 mg tablet    potassium chloride (KLOR-CON) 20 MEQ packet    hydroCHLOROthiazide (HYDRODIURIL) 25 mg tablet     No current facility-administered medications for this visit.       Objective:  Vitals:    11/25/22 1037   BP: 139/65   Pulse: 65   Weight: 67.6 kg (149 lb)   Height: 1.588 m (5' 2.5")       Physical Examination:  Constitutional: well appearing in no apparent distress  Musculoskeletal: Hip flexion and rotation without discomfort, nontender.   KNEE: No warmth or erythema. Trace effusion. Tenderness to palpation: ***.  Calf is soft, nonTTP.  ROM: ***.  5/5 strength with knee flexion and extension against resistance.  Neurovascularly intact. Stable ligamentous testing.      Anterior Drawer: negative  Posterior Drawer: negative  Lachman's: negative  Valgus stress: stable   Varus stress: stable  McMurray's: negative       Imaging:  Imaging studies were personally reviewed by me and demonstrate the following:  ***        Assessment/Plan:  Impression: ***    No diagnosis found.    The patients description of symptoms, physical examination  findings, and review of plain film radiographs, are all consistent with a diagnosis of knee osteoarthritis.  We discussed the degenerative and progressive nature of this disease.  We discussed the wide array of treatment options from conservative management for more mild disease all the way to more aggressive interventions for advanced disease.       We discussed the role chronic comorbidities and obesity play in complicating this disease and that they can limit treatment options. The patient was advised that strict control of comorbidities and weight are vital for treating osteoarthritis.       We also discussed that the definitive and most beneficial long-term treatment for this disease is physical therapy.  Physical therapy can be supplemented with other conservative treatments including ice, anti-inflammatory medications (as allowable given medical comorbidities), and injections as needed.  Injection options include corticosteroid, viscosupplementation (hyaluronic acid, or "gel shots").    The patient opted to proceed with the following:  -        No follow-ups on file.      Jeronimo Greaves, D.O.  Assistant Professor of Orthopaedics  [place] [facility]

## 2022-11-25 NOTE — Progress Notes (Addendum)
Chief Complaint   Patient presents with    Right Knee - New Patient Visit       History:  Christina [name] is a 77 y.o. female, currently undergoing chemotherapy for breast cancer, who presents today with R knee pain.    R knee pain  - ongoing for 10+ years, remembers hyperextending it about 12 years ago when swimming with the dolphins and has pain since  - pain worst in medial and lateral joint line when bearing weight  - feels R knee "catching" every once in a while, sore going up stairs (can't lead off with R leg)  - has some difficulty getting up after playing on the floor with two year old granddaughter  - has not impacted daily life much, notes decreased activity level due to ongoing chemotherapy  - pain worsens with activity level, pain level has remained stable over the last few years   - had gel injection twice, most recent in 2019 at [facility], had good relief until now  - has never had steroid injection  - not taking any medications for knee, did physical therapy early on (10+ years ago) without relief      Reviewed outside orthopaedic notes from [facility] - had Synvisc-1 injection in 2019.        Review of Systems:   No fevers, chills, sweats  No paresthesias/weakness       Pain    11/25/22 1037   PainSc:   4   PainLoc: Knee       Past medical history, past surgical history, medications, allergies, family history, social history, and review of systems were reviewed today and have been documented separately in this encounter.     Past Medical History:   Diagnosis Date    Diabetes     High blood pressure          Past Surgical History:   Procedure Laterality Date    BREAST LUMPECTOMY Right        Current Outpatient Medications   Medication    benzonatate (TESSALON) 200 mg capsule    blood glucose (ONETOUCH VERIO) test strip    semaglutide, 0.25 or 0.5 mg/dose, (OZEMPIC) pen    amLODIPine (NORVASC) 5 mg tablet    potassium chloride (KLOR-CON) 20 MEQ packet    hydroCHLOROthiazide (HYDRODIURIL) 25 mg  tablet     No current facility-administered medications for this visit.       Objective:  Vitals:    11/25/22 1037   BP: 139/65   Pulse: 65   Weight: 67.6 kg (149 lb)   Height: 1.588 m (5' 2.5")       Physical Examination:  Constitutional: well appearing in no apparent distress  Musculoskeletal: Hip flexion and rotation without discomfort, nontender.   R KNEE: No warmth or erythema. Trace effusion. Tenderness to palpation: none.  Calf is soft, nonTTP.  ROM: 90 degrees R knee, 110 degrees L knee.  5/5 strength with knee flexion and extension against resistance.  Neurovascularly intact. Stable ligamentous testing.        Imaging:  Imaging studies were personally reviewed by me and demonstrate the following:  R knee X-rays on 11/25/22:  Moderate to severe OA present in the lateral aspect of right knee with joint space narrowing, moderate narrowing in medial left knee    Assessment/Plan:  Impression: R knee OA, severe      ICD-10-CM ICD-9-CM   1. Degenerative arthritis of right knee  M17.11 715.96  The patients description of symptoms, physical examination findings, and review of plain film radiographs, are all consistent with a diagnosis of knee osteoarthritis.  We discussed the degenerative and progressive nature of this disease.  We discussed the wide array of treatment options from conservative management for more mild disease all the way to more aggressive interventions for advanced disease.       We discussed the role chronic comorbidities and obesity play in complicating this disease and that they can limit treatment options. The patient was advised that strict control of comorbidities and weight are vital for treating osteoarthritis.       We also discussed that the definitive and most beneficial long-term treatment for this disease is physical therapy.  Physical therapy can be supplemented with other conservative treatments including ice, anti-inflammatory medications (as allowable given medical  comorbidities), and injections as needed.  Injection options include corticosteroid, viscosupplementation (hyaluronic acid, or "gel shots").    The patient opted to proceed with the following:  - Will submit for approval for Synvisc-1 with US guidance of R knee (due to patient's 4 year relief with prior gel injection)    Will be contacted to schedule when approved    Cory Roughen M4    Answers submitted by the patient for this visit:  Right knee (Submitted on 11/20/2022)  Chief Complaint: RIGHT KNEE PAIN HPI  Date of onset: : 09/08/2018  Was this the result of an injury?: No  What is your pain level?: 5/10  Please describe the quality of your pain: : discomfort, dull ache  What diagnostic workup have you had for this condition?: X-ray  What treatments have you tried for this condition?: activity modification, corticosteroid injection  Is this a work related condition? : No  Current work status: : usual activities    ---------------------------------------------------------------------  Attestation:    I saw and evaluated the patient. I have reviewed and edited the medical student's note and confirm the findings and plan of care as documented above.      Jeronimo Greaves, D.O.  Assistant Professor of [employer] [facility] [place]

## 2022-11-26 LAB — UNMAPPED LAB RESULTS
Basophil # (HT): 0 10 3/uL (ref 0.0–0.2)
Basophil % (HT): 1 % (ref 0–3)
Eosinophil # (HT): 0 10 3/uL (ref 0.0–0.6)
Eosinophil % (HT): 1 % (ref 0–5)
Hematocrit (HT): 28 % — ABNORMAL LOW (ref 35–47)
Hemoglobin (HGB) (HT): 8.9 g/dL — ABNORMAL LOW (ref 12.0–16.0)
Lymphocyte # (HT): 0.4 10 3/uL — ABNORMAL LOW (ref 1.0–4.8)
Lymphocyte % (HT): 12 % — ABNORMAL LOW (ref 15–45)
MCHC (HT): 31.8 g/dL (ref 31.0–37.5)
MCV (HT): 93 fL (ref 80–100)
Mean Corpuscular Hemoglobin (MCH) (HT): 29.5 pg (ref 26.0–34.0)
Monocyte # (HT): 0.6 10 3/uL (ref 0.1–1.0)
Monocyte % (HT): 17 % — ABNORMAL HIGH (ref 0–15)
Neutrophil # (HT): 2.5 10 3/uL (ref 1.8–8.0)
Platelets (HT): 429 10 3/uL (ref 150–450)
RBC (HT): 3.02 10 6/uL — ABNORMAL LOW (ref 3.80–5.20)
RDW (HT): 17.2 % — ABNORMAL HIGH (ref 0.0–15.2)
Seg Neut % (HT): 70 % (ref 45–75)
WBC (HT): 3.6 10 3/uL — ABNORMAL LOW (ref 4.0–11.0)

## 2022-12-01 ENCOUNTER — Ambulatory Visit: Payer: Medicare (Managed Care)

## 2022-12-02 ENCOUNTER — Ambulatory Visit: Payer: Medicare (Managed Care)

## 2022-12-09 LAB — UNMAPPED LAB RESULTS
Basophil # (HT): 0 10 3/uL (ref 0.0–0.2)
Basophil % (HT): 0 % (ref 0–3)
Eosinophil # (HT): 0 10 3/uL (ref 0.0–0.6)
Eosinophil % (HT): 0 % (ref 0–5)
Hematocrit (HT): 28 % — ABNORMAL LOW (ref 35–47)
Hemoglobin (HGB) (HT): 9 g/dL — ABNORMAL LOW (ref 12.0–16.0)
Lymphocyte # (HT): 0.1 10 3/uL — ABNORMAL LOW (ref 1.0–4.8)
Lymphocyte % (HT): 1 % — ABNORMAL LOW (ref 15–45)
MCHC (HT): 32 g/dL (ref 31.0–37.5)
MCV (HT): 93 fL (ref 80–100)
Mean Corpuscular Hemoglobin (MCH) (HT): 29.7 pg (ref 26.0–34.0)
Monocyte # (HT): 0.6 10 3/uL (ref 0.1–1.0)
Monocyte % (HT): 5 % (ref 0–15)
Neutrophil # (HT): 10.9 10 3/uL — ABNORMAL HIGH (ref 1.8–8.0)
Platelets (HT): 145 10 3/uL — ABNORMAL LOW (ref 150–450)
RBC (HT): 3.03 10 6/uL — ABNORMAL LOW (ref 3.80–5.20)
RDW (HT): 18.8 % — ABNORMAL HIGH (ref 0.0–15.2)
Seg Neut % (HT): 93 % — ABNORMAL HIGH (ref 45–75)
WBC (HT): 11.7 10 3/uL — ABNORMAL HIGH (ref 4.0–11.0)

## 2022-12-10 ENCOUNTER — Inpatient Hospital Stay: Admit: 2022-12-10 | Discharge: 2022-12-10 | Disposition: A | Discharge status: Home / Self Care | Payer: Self-pay

## 2022-12-18 ENCOUNTER — Other Ambulatory Visit: Payer: Self-pay | Admitting: Plastic Surgery

## 2022-12-18 DIAGNOSIS — C50912 Malignant neoplasm of unspecified site of left female breast: Secondary | ICD-10-CM

## 2022-12-23 LAB — UNMAPPED LAB RESULTS
Basophil # (HT): 0 10 3/uL (ref 0.0–0.2)
Basophil # (HT): 0 10 3/uL (ref 0.0–0.2)
Basophil % (HT): 0 % (ref 0–3)
Basophil % (HT): 0 % (ref 0–3)
Eosinophil # (HT): 0.1 10 3/uL (ref 0.0–0.6)
Eosinophil # (HT): 0 10 3/uL (ref 0.0–0.6)
Eosinophil % (HT): 1 % (ref 0–5)
Eosinophil % (HT): 0 % (ref 0–5)
Hematocrit (HT): 27 % — ABNORMAL LOW (ref 35–47)
Hematocrit (HT): 26 % — ABNORMAL LOW (ref 35–47)
Hemoglobin (HGB) (HT): 8.5 g/dL — ABNORMAL LOW (ref 12.0–16.0)
Hemoglobin (HGB) (HT): 8.5 g/dL — ABNORMAL LOW (ref 12.0–16.0)
Lymphocyte # (HT): 0.4 10 3/uL — ABNORMAL LOW (ref 1.0–4.8)
Lymphocyte # (HT): 0.2 10 3/uL — ABNORMAL LOW (ref 1.0–4.8)
Lymphocyte % (HT): 5 % — ABNORMAL LOW (ref 15–45)
Lymphocyte % (HT): 2 % — ABNORMAL LOW (ref 15–45)
MCHC (HT): 31.5 g/dL (ref 31.0–37.5)
MCHC (HT): 32.4 g/dL (ref 31.0–37.5)
MCV (HT): 93 fL (ref 80–100)
MCV (HT): 95 fL (ref 80–100)
Mean Corpuscular Hemoglobin (MCH) (HT): 29.4 pg (ref 26.0–34.0)
Mean Corpuscular Hemoglobin (MCH) (HT): 30.8 pg (ref 26.0–34.0)
Monocyte # (HT): 0.6 10 3/uL (ref 0.1–1.0)
Monocyte # (HT): 0.1 10 3/uL (ref 0.1–1.0)
Monocyte % (HT): 7 % (ref 0–15)
Monocyte % (HT): 1 % (ref 0–15)
Neutrophil # (HT): 7.1 10 3/uL (ref 1.8–8.0)
Neutrophil # (HT): 10.1 10 3/uL — ABNORMAL HIGH (ref 1.8–8.0)
Platelets (HT): 189 10 3/uL (ref 150–450)
Platelets (HT): 200 10 3/uL (ref 150–450)
RBC (HT): 2.89 10 6/uL — ABNORMAL LOW (ref 3.80–5.20)
RBC (HT): 2.76 10 6/uL — ABNORMAL LOW (ref 3.80–5.20)
RDW (HT): 18.7 % — ABNORMAL HIGH (ref 0.0–15.2)
RDW (HT): 18.9 % — ABNORMAL HIGH (ref 0.0–15.2)
Seg Neut % (HT): 85 % — ABNORMAL HIGH (ref 45–75)
Seg Neut % (HT): 96 % — ABNORMAL HIGH (ref 45–75)
WBC (HT): 8.3 10 3/uL (ref 4.0–11.0)
WBC (HT): 10.5 10 3/uL (ref 4.0–11.0)

## 2022-12-24 LAB — UNMAPPED LAB RESULTS
Hematocrit (HT): 25 % — ABNORMAL LOW (ref 35–47)
Hemoglobin (HGB) (HT): 8.4 g/dL — ABNORMAL LOW (ref 12.0–16.0)
MCHC (HT): 33.1 g/dL (ref 31.0–37.5)
MCV (HT): 93 fL (ref 80–100)
Mean Corpuscular Hemoglobin (MCH) (HT): 30.9 pg (ref 26.0–34.0)
Platelets (HT): 189 10 3/uL (ref 150–450)
RBC (HT): 2.72 10 6/uL — ABNORMAL LOW (ref 3.80–5.20)
RDW (HT): 18.7 % — ABNORMAL HIGH (ref 0.0–15.2)
WBC (HT): 6.9 10 3/uL (ref 4.0–11.0)

## 2022-12-24 NOTE — ED Notes (Signed)
Patient expressed concern about her home medication.  Writer called attending DR. For request of meds.  Provider is working on it now

## 2022-12-24 NOTE — ED Notes (Addendum)
Writer spoke with provider.  Agreed to give the Norvasc but would like to hold off on the spironolactone until the 7 am team comes on for further evaluation.  Patient is aware.

## 2022-12-25 ENCOUNTER — Ambulatory Visit: Payer: Medicare (Managed Care) | Admitting: Sports Medicine

## 2022-12-25 LAB — UNMAPPED LAB RESULTS
Basophil # (HT): 0 10 3/uL (ref 0.0–0.2)
Basophil # (HT): 0 10 3/uL (ref 0.0–0.2)
Basophil % (HT): 0 % (ref 0–3)
Basophil % (HT): 0 % (ref 0–3)
Eosinophil # (HT): 0 10 3/uL (ref 0.0–0.6)
Eosinophil # (HT): 0 10 3/uL (ref 0.0–0.6)
Eosinophil % (HT): 0 % (ref 0–5)
Eosinophil % (HT): 0 % (ref 0–5)
Hematocrit (HT): 27 % — ABNORMAL LOW (ref 35–47)
Hematocrit (HT): 27 % — ABNORMAL LOW (ref 35–47)
Hematocrit (HT): 28 % — ABNORMAL LOW (ref 35–47)
Hemoglobin (HGB) (HT): 8.9 g/dL — ABNORMAL LOW (ref 12.0–16.0)
Hemoglobin (HGB) (HT): 8.9 g/dL — ABNORMAL LOW (ref 12.0–16.0)
Hemoglobin (HGB) (HT): 9.1 g/dL — ABNORMAL LOW (ref 12.0–16.0)
Lymphocyte # (HT): 0 10 3/uL — ABNORMAL LOW (ref 1.0–4.8)
Lymphocyte # (HT): 0.5 10 3/uL — ABNORMAL LOW (ref 1.0–4.8)
Lymphocyte % (HT): 0 % — ABNORMAL LOW (ref 15–45)
Lymphocyte % (HT): 1 % — ABNORMAL LOW (ref 15–45)
MCHC (HT): 32.7 g/dL (ref 31.0–37.5)
MCHC (HT): 32.7 g/dL (ref 31.0–37.5)
MCHC (HT): 32.6 g/dL (ref 31.0–37.5)
MCV (HT): 94 fL (ref 80–100)
MCV (HT): 94 fL (ref 80–100)
MCV (HT): 93 fL (ref 80–100)
Mean Corpuscular Hemoglobin (MCH) (HT): 30.8 pg (ref 26.0–34.0)
Mean Corpuscular Hemoglobin (MCH) (HT): 30.8 pg (ref 26.0–34.0)
Mean Corpuscular Hemoglobin (MCH) (HT): 30.4 pg (ref 26.0–34.0)
Monocyte # (HT): 1.5 10 3/uL — ABNORMAL HIGH (ref 0.1–1.0)
Monocyte # (HT): 0.5 10 3/uL (ref 0.1–1.0)
Monocyte % (HT): 3 % (ref 0–15)
Monocyte % (HT): 1 % (ref 0–15)
Neutrophil # (HT): 47.3 10 3/uL — ABNORMAL HIGH (ref 1.8–8.0)
Neutrophil # (HT): 48.6 10 3/uL — ABNORMAL HIGH (ref 1.8–8.0)
Platelets (HT): 190 10 3/uL (ref 150–450)
Platelets (HT): 190 10 3/uL (ref 150–450)
Platelets (HT): 198 10 3/uL (ref 150–450)
RBC (HT): 2.89 10 6/uL — ABNORMAL LOW (ref 3.80–5.20)
RBC (HT): 2.89 10 6/uL — ABNORMAL LOW (ref 3.80–5.20)
RBC (HT): 2.99 10 6/uL — ABNORMAL LOW (ref 3.80–5.20)
RDW (HT): 19.2 % — ABNORMAL HIGH (ref 0.0–15.2)
RDW (HT): 19.2 % — ABNORMAL HIGH (ref 0.0–15.2)
RDW (HT): 19.3 % — ABNORMAL HIGH (ref 0.0–15.2)
Seg Neut % (HT): 97 % — ABNORMAL HIGH (ref 45–75)
Seg Neut % (HT): 98 % — ABNORMAL HIGH (ref 45–75)
WBC (HT): 48.8 10 3/uL — ABNORMAL HIGH (ref 4.0–11.0)
WBC (HT): 48.8 10 3/uL — ABNORMAL HIGH (ref 4.0–11.0)
WBC (HT): 49.6 10 3/uL — ABNORMAL HIGH (ref 4.0–11.0)

## 2022-12-28 NOTE — Patient Instructions (Signed)
Since you were ADMITTED TO THE HOSPITAL, we mutually agreed on the plan and goals as listed below today:       Keep a regular schedule of follow up appointments with your primary care provider.   Please do not make any changes to your medications without talking to your provider

## 2022-12-28 NOTE — Progress Notes (Signed)
Subjective      Name Christina [name] MRN [id number]    DOB 15-Jan-1946 AGE/SEX 77 y.o./ female   Preferred Name [facility]  Pronouns       Chief Complaint Chief Complaint   Patient presents with   . Post Discharge Follow Up Visit     Pt is here for a Follow up to an elevated troponin level.Pt is a nurse and isn't pleased with the response that she received from the staff at the hospital.          Visit History for  12/29/2022   Problem List <redacted file path>* Health Maintenance <redacted file path>* Results/Data <redacted file path>*  Review Flowsheets <redacted file path>*   History of Present Illness  77 yo female with history of hypertension, diabetes and left infiltrative ductal breast carcinoma, prior history of right breast cancer in 2011 s/p right lumpectomy, RT, TCH/Herceptin now on neoadjuvant chemotherapy with Adriamycin and Cytoxan.  She was referred to the ER on 4/16 /24 due to elevated troponin/BNP levels during the screening process for anthracycline cardiotoxicity and discharged on 12/25/22.  During the hospitalization, the patient underwent a Lexiscan stress test which revealed no ischemic changes and EF of 51% and fixed apical changes consistent with small infarct or scar.  Her ECHO confirms a EF of 55% and her CT chest was negative pulmonary emboli or masses (earlier CXR had suggested a 3.2 cm retrocardiac mass, but CT did not reveal a mass).  Her amlodipine was discontinued and she was started on metoprolol.  Farxiga, furosemide and statin was recommended, but patient had refused these during her hospitalization and at discharge.                  Review of Systems   Constitutional:  Negative for chills and fever.   Respiratory:  Negative for shortness of breath.    Cardiovascular:  Negative for chest pain and palpitations.   Gastrointestinal:  Negative for abdominal pain, blood in stool, constipation, diarrhea and melena.   Genitourinary:  Negative for dysuria and hematuria.   Neurological:  Negative for  headaches.           Medications <redacted file path> and  Allergies <redacted file path>     Current Outpatient Medications   Medication Instructions   . benzonatate (TESSALON) 100 mg, Oral, 3 TIMES DAILY   . dexAMETHasone 4 MG Oral tablet Take 8 mg daily for 3 days beginning the day after chemotherapy   . lidocaine-prilocaine Top cream Apply to affected area as directed   . metoprolol SUCCINATE (TOPROL-XL) 25 mg, Oral, DAILY   . OZEMPIC 1 mg, Subcutaneous, EVERY 7 DAYS   . prochlorperazine (COMPAZINE) 10 mg, Oral, EVERY 8 HOURS PRN   . spironolactone (ALDACTONE) 25 mg, Oral, DAILY      Allergies   Allergen Reactions   . Lisinopril Shortness Of Breath/Wheezing and Other (See Comments)     Palpitations    . Acetaminophen Hives   . Hay Fever And Allergy Relief Other (See Comments)     "sneezing & scratchy throat"   . Ezetimibe Other (See Comments)     Possible cause of angioedema    . Statins-Hmg-Coa Reductase Inhibitors Other (See Comments)     Tried 2 in the past with severe myalgias and arthralgias       Objective:     Vitals  Modify Vitals <redacted file path>  height is 5' 2.6" (1.59 m) and weight is 140 lb 12.8 oz (63.9  kg). Her blood pressure is 138/84 and her pulse is 76. Her respiration is 20 and oxygen saturation is 98%.  Body mass index is 25.26 kg/m.       Physical Exam  Vitals and nursing note reviewed.   Constitutional:       Appearance: Normal appearance.   HENT:      Head: Normocephalic and atraumatic.      Right Ear: Tympanic membrane normal.      Left Ear: Tympanic membrane normal.      Mouth/Throat:      Mouth: Mucous membranes are moist.   Eyes:      Extraocular Movements: Extraocular movements intact.      Conjunctiva/sclera: Conjunctivae normal.      Pupils: Pupils are equal, round, and reactive to light.   Cardiovascular:      Rate and Rhythm: Normal rate and regular rhythm.      Pulses: Normal pulses.      Heart sounds: Normal heart sounds.   Pulmonary:      Effort: Pulmonary effort is  normal.      Breath sounds: Normal breath sounds.   Abdominal:      Palpations: Abdomen is soft. There is no mass.      Tenderness: There is no abdominal tenderness.   Musculoskeletal:      Cervical back: Neck supple.      Right lower leg: No edema.      Left lower leg: No edema.   Skin:     General: Skin is warm.   Neurological:      General: No focal deficit present.      Mental Status: She is alert and oriented to person, place, and time.   Psychiatric:         Mood and Affect: Mood normal.         Behavior: Behavior normal.                 Assessment/Plan <redacted file path>   Clinical References <redacted file path>*   Pt Intr & Followup <redacted file path>*   Wrap Up <redacted file path>*     Issues Addressed/ Plan <redacted file path>      Diagnosis Plan   1. Elevated troponin level not due to acute coronary syndrome        2. Primary hypertension        3. Mixed hyperlipidemia        4. Inadequately controlled diabetes mellitus  semaglutide (OZEMPIC) 1 mg/dose (4 mg/3 mL) SubQ PnIj      5. Infiltrating ductal carcinoma of left breast        6. Other elevated white blood cell (WBC) count             I personally reviewed her hospital notes, imaging study results, cardiac test results and lab results     Mild Anthracycline Cardiac Toxicity with an elevated troponin, but EF is preserved and patient is in stable condition           - continue metoprolol ER 50 mg daily           - pt refusing furosemide as she is not in heart failure (EF 54%) and no edema and she is already on spironolactone which is causing her to go to the bathroom frequently already.  This appears to be reasonable at this time           2.  Hypertension           -  Watch a low sodium diet, avoid processed foods and exercise daily           - monitor on change from amlodipine to metoprolol and continue spironolactone           - discussed with patient that if BP remains borderline or increase, we will increase the metoprolol to 37.5 or 50 mg  daily    3.  Hyperlipidemia (failed 2 statins in the past)           - Watch a low cholesterol diet (minimize eating red meats, shellfish, egg yolks, butter, deep fried foods and creamy foods) and exercise daily.              - considering Repatha after chemo (pt had intolerable side effects to statin and ezetimibe in the past)    4.  DM           - discussed importance of reaching goal Hbg A1C of less than 7% to minimize long term end-organ risks such as blindness, heart disease, renal failure, peripheral vascular disease.            - reminded patient on importance of annual retinopathy screening           - check Hbg A1C  in 3 months           - increase Ozempic to 1 mg weekly (pt had refused Farxiga at the hospital)    5.  Breast Cancer           - continue treatment per oncology with plans to complete chemo and then consider lumpectomy of left breast cancer mass    6.  Leukocytosis aft having Neulasta            - no signs of infection but patient is aware of monitoring for symptoms            - pt to see oncology next week and will have repeat labs done at that time                        Follow up  recommended <redacted file path> Return in about 3 months (around 03/19/2023).     Thurnell Lose, MD

## 2022-12-30 ENCOUNTER — Encounter: Payer: Self-pay | Admitting: Sports Medicine

## 2023-01-01 ENCOUNTER — Other Ambulatory Visit: Payer: Self-pay

## 2023-01-01 ENCOUNTER — Encounter: Payer: Self-pay | Admitting: Sports Medicine

## 2023-01-01 ENCOUNTER — Ambulatory Visit: Payer: Medicare (Managed Care) | Admitting: Sports Medicine

## 2023-01-01 VITALS — BP 145/67 | HR 76 | Ht 64.5 in | Wt 145.0 lb

## 2023-01-01 DIAGNOSIS — M1711 Unilateral primary osteoarthritis, right knee: Secondary | ICD-10-CM

## 2023-01-01 NOTE — Telephone Encounter (Signed)
Not sure what is going on with this pt - she has 2 appts in May one at [facility] on 5/17 and one at [facility] on 5/21? Does she need both? Also when is this echo needed? Says 3 months but the appts that were scheduled also said 3 month follow up? So is the echo needed before the May follow up?     Please advise. Thanks

## 2023-01-02 MED ORDER — LIDOCAINE HCL 1 % IJ SOLN *I*
1.5000 mL | Freq: Once | INTRAMUSCULAR | Status: AC | PRN
Start: 2023-01-01 — End: 2023-01-01
  Administered 2023-01-01: 1.5 mL via INTRA_ARTICULAR

## 2023-01-02 MED ORDER — LIDOCAINE HCL 1 % IJ SOLN *I*
1.0000 mL | Freq: Once | INTRAMUSCULAR | Status: AC | PRN
Start: 2023-01-01 — End: 2023-01-01
  Administered 2023-01-01: 1 mL via INTRA_ARTICULAR

## 2023-01-02 MED ORDER — LIDOCAINE HCL 1 % IJ SOLN *I*
0.5000 mL | Freq: Once | INTRAMUSCULAR | Status: AC | PRN
Start: 2023-01-01 — End: 2023-01-01
  Administered 2023-01-01: .5 mL via INTRA_ARTICULAR

## 2023-01-02 MED ORDER — HYLAN 48 MG/6ML IX SOSY *I*
48.0000 mg | PREFILLED_SYRINGE | Freq: Once | INTRA_ARTICULAR | Status: AC | PRN
Start: 2023-01-01 — End: 2023-01-01
  Administered 2023-01-01: 48 mg via INTRA_ARTICULAR

## 2023-01-02 NOTE — Procedures (Signed)
Large Joint Aspiration/Injection Procedure: R knee intra - articular    Date/Time: 01/01/2023  3:40 PM EDT  Consent given by: patient  Site marked: site marked  Timeout: Immediately prior to procedure a time out was called to verify the correct patient, procedure, equipment, support staff and site/side marked as required     Procedure Details    Location: knee - R knee intra - articular  Preparation: The site was prepped using the usual aseptic technique.  Ultrasound guidance:  Ultrasound was utilized to improve needle visualization, injection accuracy, and anatomic localization.    Anesthetics administered:  1.5 mL lidocaine HCL 1 %; 1 mL lidocaine HCL 1 %; 0.5 mL lidocaine HCL 1 %  Viscosupplementation & other medications administered:  48 mg hylan 48 MG/6ML  Dressing:  A dry, sterile dressing was applied.  Patient tolerance: patient tolerated the procedure well with no immediate complications

## 2023-01-06 LAB — UNMAPPED LAB RESULTS
Basophil # (HT): 0 10 3/uL (ref 0.0–0.2)
Basophil % (HT): 0 % (ref 0–3)
Eosinophil # (HT): 0.3 10 3/uL (ref 0.0–0.6)
Eosinophil % (HT): 4 % (ref 0–5)
Hematocrit (HT): 26 % — ABNORMAL LOW (ref 35–47)
Hemoglobin (HGB) (HT): 8 g/dL — ABNORMAL LOW (ref 12.0–16.0)
Lymphocyte # (HT): 0.4 10 3/uL — ABNORMAL LOW (ref 1.0–4.8)
Lymphocyte % (HT): 5 % — ABNORMAL LOW (ref 15–45)
MCHC (HT): 31.4 g/dL (ref 31.0–37.5)
MCV (HT): 97 fL (ref 80–100)
Mean Corpuscular Hemoglobin (MCH) (HT): 30.5 pg (ref 26.0–34.0)
Monocyte # (HT): 0.5 10 3/uL (ref 0.1–1.0)
Monocyte % (HT): 7 % (ref 0–15)
Neutrophil # (HT): 5.8 10 3/uL (ref 1.8–8.0)
Platelets (HT): 265 10 3/uL (ref 150–450)
RBC (HT): 2.62 10 6/uL — ABNORMAL LOW (ref 3.80–5.20)
RDW (HT): 18.9 % — ABNORMAL HIGH (ref 0.0–15.2)
Seg Neut % (HT): 83 % — ABNORMAL HIGH (ref 45–75)
WBC (HT): 7 10 3/uL (ref 4.0–11.0)

## 2023-01-12 ENCOUNTER — Inpatient Hospital Stay: Admit: 2023-01-12 | Discharge: 2023-01-12 | Disposition: A | Discharge status: Home / Self Care | Payer: Self-pay

## 2023-01-14 ENCOUNTER — Inpatient Hospital Stay: Admit: 2023-01-14 | Discharge: 2023-01-14 | Disposition: A | Discharge status: Home / Self Care | Payer: Self-pay

## 2023-01-16 LAB — UNMAPPED LAB RESULTS
Hematocrit (HT): 32 % — ABNORMAL LOW (ref 34–47)
Hemoglobin (HGB) (HT): 10.4 g/dL — ABNORMAL LOW (ref 11.5–16.0)
MCHC (HT): 32.7 g/dL (ref 32.0–36.0)
MCV (HT): 95.5 fL (ref 81.0–99.0)
Mean Corpuscular Hemoglobin (MCH) (HT): 31.2 pg (ref 26.0–34.0)
Platelets (HT): 316 10 3/uL (ref 150–450)
RBC (HT): 3.33 10 6/uL — ABNORMAL LOW (ref 3.80–5.20)
RDW (HT): 16.1 % — ABNORMAL HIGH (ref 11.5–15.0)
WBC (HT): 3.6 10 3/uL — ABNORMAL LOW (ref 4.0–10.8)

## 2023-01-28 ENCOUNTER — Other Ambulatory Visit: Payer: Medicare (Managed Care)

## 2023-02-16 LAB — UNMAPPED LAB RESULTS
Basophil # (HT): 0 10 3/uL (ref 0.0–0.2)
Basophil % (HT): 0 % (ref 0–2)
Eosinophil # (HT): 0.1 10 3/uL (ref 0.0–0.5)
Eosinophil % (HT): 3 % (ref 0–7)
Hematocrit (HT): 37 % (ref 34–47)
Hemoglobin (HGB) (HT): 12.1 g/dL (ref 11.5–16.0)
Lymphocyte # (HT): 0.6 10 3/uL — ABNORMAL LOW (ref 0.9–3.8)
Lymphocyte % (HT): 18 % (ref 17–44)
MCHC (HT): 33 g/dL (ref 32.0–36.0)
MCV (HT): 89.3 fL (ref 81.0–99.0)
Mean Corpuscular Hemoglobin (MCH) (HT): 29.4 pg (ref 26.0–34.0)
Monocyte # (HT): 0.2 10 3/uL (ref 0.2–1.0)
Monocyte % (HT): 7 % (ref 4–12)
Neutrophil # (HT): 2.2 10 3/uL (ref 1.5–7.7)
Platelets (HT): 261 10 3/uL (ref 150–450)
RBC (HT): 4.11 10 6/uL (ref 3.80–5.20)
RDW (HT): 12.4 % (ref 11.5–15.0)
Seg Neut % (HT): 72 % (ref 40–75)
WBC (HT): 3.1 10 3/uL — ABNORMAL LOW (ref 4.0–10.8)

## 2023-02-18 ENCOUNTER — Inpatient Hospital Stay: Admit: 2023-02-18 | Discharge: 2023-02-18 | Disposition: A | Discharge status: Home / Self Care | Payer: Self-pay

## 2023-02-18 HISTORY — PX: BREAST LUMPECTOMY: SHX2

## 2023-05-12 LAB — UNMAPPED LAB RESULTS
Basophil # (HT): 0 10 3/uL (ref 0.0–0.2)
Basophil % (HT): 1 % (ref 0–3)
Eosinophil # (HT): 0.1 10 3/uL (ref 0.0–0.6)
Eosinophil % (HT): 2 % (ref 0–5)
Hematocrit (HT): 34 % — ABNORMAL LOW (ref 35–47)
Hemoglobin (HGB) (HT): 10.9 g/dL — ABNORMAL LOW (ref 12.0–16.0)
Lymphocyte # (HT): 0.3 10 3/uL — ABNORMAL LOW (ref 1.0–4.8)
Lymphocyte % (HT): 9 % — ABNORMAL LOW (ref 15–45)
MCHC (HT): 32.2 g/dL (ref 31.0–37.5)
MCV (HT): 85 fL (ref 80–100)
Mean Corpuscular Hemoglobin (MCH) (HT): 27.4 pg (ref 26.0–34.0)
Monocyte # (HT): 0.3 10 3/uL (ref 0.1–1.0)
Monocyte % (HT): 8 % (ref 0–15)
Neutrophil # (HT): 2.8 10 3/uL (ref 1.8–8.0)
Platelets (HT): 196 10 3/uL (ref 150–450)
RBC (HT): 3.98 10 6/uL (ref 3.80–5.20)
RDW (HT): 14.5 % (ref 0.0–15.2)
Seg Neut % (HT): 80 % — ABNORMAL HIGH (ref 45–75)
WBC (HT): 3.5 10 3/uL — ABNORMAL LOW (ref 4.0–11.0)

## 2023-06-23 LAB — UNMAPPED LAB RESULTS
Basophil # (HT): 0 10 3/uL (ref 0.0–0.2)
Basophil % (HT): 0 % (ref 0–3)
Eosinophil # (HT): 0.1 10 3/uL (ref 0.0–0.6)
Eosinophil % (HT): 4 % (ref 0–5)
Hematocrit (HT): 33 % — ABNORMAL LOW (ref 35–47)
Hemoglobin (HGB) (HT): 11.1 g/dL — ABNORMAL LOW (ref 12.0–16.0)
Lymphocyte # (HT): 0.4 10 3/uL — ABNORMAL LOW (ref 1.0–4.8)
Lymphocyte % (HT): 16 % (ref 15–45)
MCHC (HT): 33.4 g/dL (ref 31.0–37.5)
MCV (HT): 90 fL (ref 80–100)
Mean Corpuscular Hemoglobin (MCH) (HT): 29.9 pg (ref 26.0–34.0)
Monocyte # (HT): 0.2 10 3/uL (ref 0.1–1.0)
Monocyte % (HT): 9 % (ref 0–15)
Neutrophil # (HT): 1.6 10 3/uL — ABNORMAL LOW (ref 1.8–8.0)
Platelets (HT): 212 10 3/uL (ref 150–450)
RBC (HT): 3.71 10 6/uL — ABNORMAL LOW (ref 3.80–5.20)
RDW (HT): 14.3 % (ref 0.0–15.2)
Seg Neut % (HT): 71 % (ref 45–75)
WBC (HT): 2.3 10 3/uL — ABNORMAL LOW (ref 4.0–11.0)

## 2023-07-22 LAB — UNMAPPED LAB RESULTS
Basophil # (HT): 0 10 3/uL (ref 0.0–0.2)
Basophil % (HT): 1 % (ref 0–3)
Eosinophil # (HT): 0.1 10 3/uL (ref 0.0–0.6)
Eosinophil % (HT): 2 % (ref 0–5)
Hematocrit (HT): 34 % — ABNORMAL LOW (ref 35–47)
Hemoglobin (HGB) (HT): 11.2 g/dL — ABNORMAL LOW (ref 12.0–16.0)
Lymphocyte # (HT): 0.5 10 3/uL — ABNORMAL LOW (ref 1.0–4.8)
Lymphocyte % (HT): 22 % (ref 15–45)
MCHC (HT): 32.9 g/dL (ref 31.0–37.5)
MCV (HT): 91 fL (ref 80–100)
Mean Corpuscular Hemoglobin (MCH) (HT): 30 pg (ref 26.0–34.0)
Monocyte # (HT): 0.2 10 3/uL (ref 0.1–1.0)
Monocyte % (HT): 8 % (ref 0–15)
Neutrophil # (HT): 1.4 10 3/uL — ABNORMAL LOW (ref 1.8–8.0)
Platelets (HT): 203 10 3/uL (ref 150–450)
RBC (HT): 3.73 10 6/uL — ABNORMAL LOW (ref 3.80–5.20)
RDW (HT): 13.2 % (ref 0.0–15.2)
Seg Neut % (HT): 67 % (ref 45–75)
WBC (HT): 2.1 10 3/uL — ABNORMAL LOW (ref 4.0–11.0)

## 2023-07-28 LAB — UNMAPPED LAB RESULTS
Basophil # (HT): 0 10 3/uL (ref 0.0–0.2)
Basophil % (HT): 1 % (ref 0–3)
Eosinophil # (HT): 0 10 3/uL (ref 0.0–0.6)
Eosinophil % (HT): 1 % (ref 0–5)
Hematocrit (HT): 33 % — ABNORMAL LOW (ref 35–47)
Hemoglobin (HGB) (HT): 10.6 g/dL — ABNORMAL LOW (ref 12.0–16.0)
Lymphocyte # (HT): 0.5 10 3/uL — ABNORMAL LOW (ref 1.0–4.8)
Lymphocyte % (HT): 21 % (ref 15–45)
MCHC (HT): 32.1 g/dL (ref 31.0–37.5)
MCV (HT): 90 fL (ref 80–100)
Mean Corpuscular Hemoglobin (MCH) (HT): 28.9 pg (ref 26.0–34.0)
Monocyte # (HT): 0.1 10 3/uL (ref 0.1–1.0)
Monocyte % (HT): 6 % (ref 0–15)
Neutrophil # (HT): 1.6 10 3/uL — ABNORMAL LOW (ref 1.8–8.0)
Platelets (HT): 176 10 3/uL (ref 150–450)
RBC (HT): 3.67 10 6/uL — ABNORMAL LOW (ref 3.80–5.20)
RDW (HT): 13.2 % (ref 0.0–15.2)
Seg Neut % (HT): 70 % (ref 45–75)
WBC (HT): 2.2 10 3/uL — ABNORMAL LOW (ref 4.0–11.0)

## 2023-08-04 LAB — UNMAPPED LAB RESULTS
Basophil # (HT): 0 10 3/uL (ref 0.0–0.2)
Basophil % (HT): 1 % (ref 0–3)
Eosinophil # (HT): 0 10 3/uL (ref 0.0–0.6)
Eosinophil % (HT): 1 % (ref 0–5)
Hematocrit (HT): 31 % — ABNORMAL LOW (ref 35–47)
Hemoglobin (HGB) (HT): 10.1 g/dL — ABNORMAL LOW (ref 12.0–16.0)
Lymphocyte # (HT): 0.3 10 3/uL — ABNORMAL LOW (ref 1.0–4.8)
Lymphocyte % (HT): 19 % (ref 15–45)
MCHC (HT): 32.5 g/dL (ref 31.0–37.5)
MCV (HT): 92 fL (ref 80–100)
Mean Corpuscular Hemoglobin (MCH) (HT): 29.8 pg (ref 26.0–34.0)
Monocyte # (HT): 0.1 10 3/uL (ref 0.1–1.0)
Monocyte % (HT): 5 % (ref 0–15)
Neutrophil # (HT): 1.3 10 3/uL — ABNORMAL LOW (ref 1.8–8.0)
Platelets (HT): 132 10 3/uL — ABNORMAL LOW (ref 150–450)
RBC (HT): 3.39 10 6/uL — ABNORMAL LOW (ref 3.80–5.20)
RDW (HT): 13.2 % (ref 0.0–15.2)
Seg Neut % (HT): 74 % (ref 45–75)
WBC (HT): 1.7 10 3/uL — ABNORMAL LOW (ref 4.0–11.0)

## 2023-08-11 LAB — UNMAPPED LAB RESULTS
Basophil # (HT): 0.1 10 3/uL (ref 0.0–0.2)
Basophil % (HT): 4 % — ABNORMAL HIGH (ref 0–3)
Eosinophil # (HT): 0 10 3/uL (ref 0.0–0.6)
Eosinophil % (HT): 1 % (ref 0–5)
Hematocrit (HT): 30 % — ABNORMAL LOW (ref 35–47)
Hemoglobin (HGB) (HT): 9.9 g/dL — ABNORMAL LOW (ref 12.0–16.0)
Lymphocyte # (HT): 0.4 10 3/uL — ABNORMAL LOW (ref 1.0–4.8)
Lymphocyte % (HT): 22 % (ref 15–45)
MCHC (HT): 33 g/dL (ref 31.0–37.5)
MCV (HT): 90 fL (ref 80–100)
Mean Corpuscular Hemoglobin (MCH) (HT): 29.6 pg (ref 26.0–34.0)
Monocyte # (HT): 0.1 10 3/uL (ref 0.1–1.0)
Monocyte % (HT): 5 % (ref 0–15)
Neutrophil # (HT): 1.1 10 3/uL — ABNORMAL LOW (ref 1.8–8.0)
Platelets (HT): 159 10 3/uL (ref 150–450)
RBC (HT): 3.34 10 6/uL — ABNORMAL LOW (ref 3.80–5.20)
RDW (HT): 13.5 % (ref 0.0–15.2)
Seg Neut % (HT): 68 % (ref 45–75)
WBC (HT): 1.6 10 3/uL — ABNORMAL LOW (ref 4.0–11.0)

## 2023-08-19 LAB — UNMAPPED LAB RESULTS
Basophil # (HT): 0 10 3/uL (ref 0.0–0.2)
Basophil % (HT): 1 % (ref 0–3)
Eosinophil # (HT): 0 10 3/uL (ref 0.0–0.6)
Eosinophil % (HT): 2 % (ref 0–5)
Hematocrit (HT): 31 % — ABNORMAL LOW (ref 35–47)
Hemoglobin (HGB) (HT): 10 g/dL — ABNORMAL LOW (ref 12.0–16.0)
Lymphocyte # (HT): 0.4 10 3/uL — ABNORMAL LOW (ref 1.0–4.8)
Lymphocyte % (HT): 24 % (ref 15–45)
MCHC (HT): 32.4 g/dL (ref 31.0–37.5)
MCV (HT): 93 fL (ref 80–100)
Mean Corpuscular Hemoglobin (MCH) (HT): 30.1 pg (ref 26.0–34.0)
Monocyte # (HT): 0.2 10 3/uL (ref 0.1–1.0)
Monocyte % (HT): 12 % (ref 0–15)
Neutrophil # (HT): 1 10 3/uL — ABNORMAL LOW (ref 1.8–8.0)
Platelets (HT): 164 10 3/uL (ref 150–450)
RBC (HT): 3.32 10 6/uL — ABNORMAL LOW (ref 3.80–5.20)
RDW (HT): 14.7 % (ref 0.0–15.2)
Seg Neut % (HT): 61 % (ref 45–75)
WBC (HT): 1.7 10 3/uL — ABNORMAL LOW (ref 4.0–11.0)

## 2023-08-25 LAB — UNMAPPED LAB RESULTS
Basophil # (HT): 0 10 3/uL (ref 0.0–0.2)
Basophil % (HT): 1 % (ref 0–3)
Eosinophil # (HT): 0 10 3/uL (ref 0.0–0.6)
Eosinophil % (HT): 3 % (ref 0–5)
Hematocrit (HT): 30 % — ABNORMAL LOW (ref 35–47)
Hemoglobin (HGB) (HT): 10 g/dL — ABNORMAL LOW (ref 12.0–16.0)
Lymphocyte # (HT): 0.3 10 3/uL — ABNORMAL LOW (ref 1.0–4.8)
Lymphocyte % (HT): 16 % (ref 15–45)
MCHC (HT): 32.9 g/dL (ref 31.0–37.5)
MCV (HT): 94 fL (ref 80–100)
Mean Corpuscular Hemoglobin (MCH) (HT): 30.8 pg (ref 26.0–34.0)
Monocyte # (HT): 0.1 10 3/uL (ref 0.1–1.0)
Monocyte % (HT): 5 % (ref 0–15)
Neutrophil # (HT): 1.2 10 3/uL — ABNORMAL LOW (ref 1.8–8.0)
Platelets (HT): 190 10 3/uL (ref 150–450)
RBC (HT): 3.25 10 6/uL — ABNORMAL LOW (ref 3.80–5.20)
RDW (HT): 15.5 % — ABNORMAL HIGH (ref 0.0–15.2)
Seg Neut % (HT): 75 % (ref 45–75)
WBC (HT): 1.6 10 3/uL — ABNORMAL LOW (ref 4.0–11.0)

## 2023-08-27 ENCOUNTER — Inpatient Hospital Stay: Admit: 2023-08-27 | Discharge: 2023-08-27 | Disposition: A | Discharge status: Home / Self Care | Payer: Self-pay

## 2023-09-22 LAB — UNMAPPED LAB RESULTS
Basophil # (HT): 0 10 3/uL (ref 0.0–0.2)
Basophil % (HT): 2 % (ref 0–3)
Eosinophil # (HT): 0.1 10 3/uL (ref 0.0–0.6)
Eosinophil % (HT): 4 % (ref 0–5)
Hematocrit (HT): 29 % — ABNORMAL LOW (ref 35–47)
Hemoglobin (HGB) (HT): 9.6 g/dL — ABNORMAL LOW (ref 12.0–16.0)
Lymphocyte # (HT): 0.5 10 3/uL — ABNORMAL LOW (ref 1.0–4.8)
Lymphocyte % (HT): 23 % (ref 15–45)
MCHC (HT): 33 g/dL (ref 31.0–37.5)
MCV (HT): 95 fL (ref 80–100)
Mean Corpuscular Hemoglobin (MCH) (HT): 31.3 pg (ref 26.0–34.0)
Monocyte # (HT): 0.3 10 3/uL (ref 0.1–1.0)
Monocyte % (HT): 11 % (ref 0–15)
Neutrophil # (HT): 1.4 10 3/uL — ABNORMAL LOW (ref 1.8–8.0)
Platelets (HT): 173 10 3/uL (ref 150–450)
RBC (HT): 3.07 10 6/uL — ABNORMAL LOW (ref 3.80–5.20)
RDW (HT): 17.1 % — ABNORMAL HIGH (ref 0.0–15.2)
Seg Neut % (HT): 60 % (ref 45–75)
WBC (HT): 2.3 10 3/uL — ABNORMAL LOW (ref 4.0–11.0)

## 2023-10-26 LAB — UNMAPPED LAB RESULTS
Basophil # (HT): 0 10 3/uL (ref 0.0–0.2)
Basophil % (HT): 1 % (ref 0–3)
Eosinophil # (HT): 0 10 3/uL (ref 0.0–0.6)
Eosinophil % (HT): 2 % (ref 0–5)
Hematocrit (HT): 31 % — ABNORMAL LOW (ref 35–47)
Hemoglobin (HGB) (HT): 10.7 g/dL — ABNORMAL LOW (ref 12.0–16.0)
Lymphocyte # (HT): 0.4 10 3/uL — ABNORMAL LOW (ref 1.0–4.8)
Lymphocyte % (HT): 20 % (ref 15–45)
MCHC (HT): 34.1 g/dL (ref 31.0–37.5)
MCV (HT): 98 fL (ref 80–100)
Mean Corpuscular Hemoglobin (MCH) (HT): 33.5 pg (ref 26.0–34.0)
Monocyte # (HT): 0.2 10 3/uL (ref 0.1–1.0)
Monocyte % (HT): 9 % (ref 0–15)
Neutrophil # (HT): 1.5 10 3/uL — ABNORMAL LOW (ref 1.8–8.0)
Platelets (HT): 182 10 3/uL (ref 150–450)
RBC (HT): 3.19 10 6/uL — ABNORMAL LOW (ref 3.80–5.20)
RDW (HT): 15.1 % (ref 0.0–15.2)
Seg Neut % (HT): 68 % (ref 45–75)
WBC (HT): 2.2 10 3/uL — ABNORMAL LOW (ref 4.0–11.0)

## 2023-11-06 ENCOUNTER — Telehealth: Payer: Self-pay | Admitting: Plastic Surgery

## 2023-11-06 NOTE — Telephone Encounter (Signed)
 Spoke to [facility] and daughter and scheduled her to see Dr. Corrie Dandy on 11/26/23 at 3:30pm.

## 2023-11-06 NOTE — Telephone Encounter (Signed)
 Please call daughter back to schedule a follow up visit for patient

## 2023-11-24 LAB — UNMAPPED LAB RESULTS
Basophil # (HT): 0.1 10 3/uL (ref 0.0–0.2)
Basophil % (HT): 4 % — ABNORMAL HIGH (ref 0–3)
Eosinophil # (HT): 0.1 10 3/uL (ref 0.0–0.6)
Eosinophil % (HT): 3 % (ref 0–5)
Hematocrit (HT): 31 % — ABNORMAL LOW (ref 35–47)
Hemoglobin (HGB) (HT): 10 g/dL — ABNORMAL LOW (ref 12.0–16.0)
Lymphocyte # (HT): 0.3 10 3/uL — ABNORMAL LOW (ref 1.0–4.8)
Lymphocyte % (HT): 15 % (ref 15–45)
MCHC (HT): 32.8 g/dL (ref 31.0–37.5)
MCV (HT): 103 fL — ABNORMAL HIGH (ref 80–100)
Mean Corpuscular Hemoglobin (MCH) (HT): 33.8 pg (ref 26.0–34.0)
Monocyte # (HT): 0.1 10 3/uL (ref 0.1–1.0)
Monocyte % (HT): 8 % (ref 0–15)
Neutrophil # (HT): 1.3 10 3/uL — ABNORMAL LOW (ref 1.8–8.0)
Platelets (HT): 160 10 3/uL (ref 150–450)
RBC (HT): 2.96 10 6/uL — ABNORMAL LOW (ref 3.80–5.20)
RDW (HT): 13 % (ref 0.0–15.2)
Seg Neut % (HT): 70 % (ref 45–75)
WBC (HT): 1.8 10 3/uL — ABNORMAL LOW (ref 4.0–11.0)

## 2023-11-24 NOTE — Progress Notes (Signed)
 Follow-up Visit   NAME:  Christina [name]  MRN:  [id number]    DOB:  03/04/1946  Date:  11/24/2023   Collapsed details  Oncology History <redacted file path>   HemOnc History Overview Note   Personal history of contralateral/right-sided breast cancer 2011 diagnosed and treated in [place]-stage T2 N1a, ER/PR negative HER2 positive-treated with surgery followed by [facility] chemotherapy followed by breast RT and 1 year of Herceptin     Infiltrating ductal carcinoma of left breast   09/15/2022 Initial Diagnosis    Infiltrating ductal carcinoma of left breast-new abnormality in the left breast and axilla screen detected led to diagnostic studies and image guided biopsies-workup was done at [facility] in [place]  Biopsies confirmed IDC in the left breast ER positive, PR low, HER2/neu 2+ on IHC FISH negative  Metastatic involvement in the lymph node confirmed      Other    Oncotype DX score-28     09/15/2022 Cancer Staged    CT chest abdomen pelvis 07/30/2022-no metastatic disease  Bone scan 10/17/2022-no evidence of osseous metastatic disease  Breast MRI irregular 25 x 13 x 23 mm mass in the left breast upper outer quadrant at 2:00 corresponding to biopsy-proven IDC.  Linear enhancement measuring 16 mm from the inferior aspect of the index mass-overall involved area 33 mm.  Multiple abnormal lymph nodes of varying size in the left axilla.  Largest abnormal node measuring 31 mm  Right breast postlumpectomy scar no suspicious right axillary adenopathy.  Incidental note of liver masses probably cysts(no liver mass on CT)     10/07/2022 - 12/23/2022 Systemic Therapy    Neoadjuvant  Dose Dense AC to Dose Dense PACLitaxel  Treatment stopped after 2 cycles of paclitaxel due to concern for cardiotoxicity     10/07/2022 - 12/23/2022 Systemic Therapy    Dose dense AC x 4 followed by dose dense Taxol treatment stopped after 2 cycles due to toxicity     02/18/2023 Surgery    Left breast lumpectomy and lymph node dissection    Final  pathology revealed residual carcinoma and involvement of lymph nodes-7 mm residual tumor in the breast.  2 out of 8 lymph nodes with metastatic disease, 8 mm and 10 mm respectively with ENE  ER positive PR positive HER2/neu negative  ypT1b N1a     03/19/2023 -  Systemic Therapy    Letrozole  Will add Verzenio and Zometa after completion of RT     04/21/2023 - 06/05/2023 Radiation    RT to the left breast with tumor bed boost to a dose of 60.4 Gray in 33 fractions and regional lymph nodes to a dose of 50.4 Gray in 28 fractions from 04/21/2023 to 06/05/2023.      06/23/2023 -  Systemic Therapy     Verzenio 150 mg twice daily  Letrozole to continue  Zometa 4 mg every 6 months     Breast cancer of upper-outer quadrant of left female breast   Malignant neoplasm of upper-outer quadrant of female breast, unspecified estrogen receptor status, unspecified laterality   Breast cancer         Subjective     History of Present Illness and ROS:   Christina [name] comes in for follow-up visit.  She is doing really well.  Is currently on Verzenio and letrozole.  Also received a dose of Zometa.  Treatment tolerance is excellent.  Mild myelosuppression not requiring interruption in therapy.  No diarrhea.  Did well with Zometa also  with flulike symptoms but recovered promptly.  At the last visit she had palpated a lump in her right breast in the subareolar area.  We evaluated this with breast imaging and likely benign area of dystrophic calcification.  See mammogram report below.  Repeat mammogram is due in June  Will be going to [place] with family next week.  Looking forward to travel and sunshine  She will be seeing Dr. Corrie Dandy today after tomorrow.  Patient History <redacted file path>: Past medical, family & social history was reviewed in the EMR.     Medications <redacted file path>:  Current Outpatient Medications   Medication Sig   . abemaciclib (VERZENIO) 150 mg Oral Tab Take 1 tablet by mouth 2 (two) times a day.   Marland Kitchen amLODIPine 5 MG Oral  tablet Take 1 tablet by mouth daily.   Marland Kitchen aspirin 81 MG Oral EC tablet Take 1 tablet by mouth daily.   . ergocalciferol (VITAMIN D2) 1,250 mcg (50,000 unit) Oral capsule Take 1 capsule by mouth once a week. Indications: low vitamin D levels   . hydroCHLOROthiazide 25 MG Oral tablet Take 1 tablet by mouth daily.   Marland Kitchen KLOR-CON M20 20 mEq Oral tablet TAKE 1 TABLET BY MOUTH EVERY DAY   . letrozole 2.5 mg Oral tablet Take 1 tablet by mouth daily.   . metFORMIN 500 MG Oral 24 hr tablet Take 1 tablet by mouth daily with breakfast for 134 days.   . mometasone 0.1 % Top cream Apply 1 Application topically 2 (two) times a day.            10/26/2023     2:44 PM   ECOG, NCI CTC   ECOG Performance Status 1   Peripheral Sensory Neuropathy Grade 1   Neutropenia Grade 1   Anemia Grade 1        Objective:     Physical Exam:   CONSTITUTIONAL: Blood pressure (!) 162/78, pulse 61, temperature 36.2 C (97.1 F), temperature source Temporal, weight 69.4 kg (153 lb 1.6 oz), SpO2 99%.  GENERAL: Age appropriate female who is not in acute distress.   RESPIRATORY: Clear to auscultation bilaterally.   CARDIOVASCULAR: regular heart sounds.   GASTROINTESTINAL: non-distended, nontender. There is no hepatosplenomegaly.   NEUROLOGIC: AAO x 3. No gross motor deficit. Speech and Gait normal.   EXTREMITIES: No pedal edema.   PSYCHOSOCIAL: Mood, judgment, affect and insight are intact.   BREAST: No new changes and deferred exam today since she will be seeing her breast surgeon in 2 days     Results/Data <redacted file path>: I reviewed the following labs today   Hematology:  Lab Results   Component Value Date    WBCR 1.8 (L) 11/24/2023    HGB 10.0 (L) 11/24/2023    HCT 31 (L) 11/24/2023    MCV 103 (H) 11/24/2023    PLTR 160 11/24/2023     Lab Results   Component Value Date    NEUAR 1.3 (L) 11/24/2023    LYMAR 0.3 (L) 11/24/2023              Chemistries:  Lab Results   Component Value Date    NA 142 11/24/2023    K 3.5 11/24/2023    GLU 134 (H) 11/24/2023     CA 8.9 11/24/2023    BUN 26 (H) 11/24/2023    CREAT 0.8 11/24/2023    ALB 4.0 11/24/2023    ALT 22 11/24/2023    AST 14 11/24/2023  TBIL 0.6 11/24/2023    ALK 68 11/24/2023        Imaging: I reviewed the following results today   Mammogram 08/27/2023  Findings: There are scattered areas of fibroglandular density (Density Category B).      Right breast:Postsurgical changes of the central breast with large area of progressive dystrophic calcification.     Left breast:There is no suspicious dominant mass or suspicious microcalcifications. Postsurgical changes of the upper outer breast at posterior depth.     Patient underwent same-day breast ultrasound evaluation, which is reported separately.      Impression: No mammographic evidence of malignancy     OVERALL ASSESSMENT:BI-RADS 3  Probably Benign     Recommended follow-up: Six month follow up     Results were given to the patient today at the time of the appointment.     Signed by Attending: [place] Cain, MD on 08/27/2023 10:30 AM     Assessment/Plan <redacted file path> (Pt Intr & Followup <redacted file path>):   78 y.o. female with  cT2 N1/N2 left sided ER positive PR low HER2 negative (2+ IHC negative by FISH) IDC  (personal history of T2 N1 ER negative HER2 positive right breast cancer in 2011 s/p surgery RT,TCH /Herceptin))    Left breast cancer ER/PR positive HER2/neu negative adjuvant therapy  -She is continuing to do well on current therapy letrozole and Verzenio.  Will continue without changes  -Zometa every 6 months-this is due next month    History of right breast cancer ER/PR negative HER2/neu positive in 2011 now with a palpable mass  -This was investigated with a digital diagnostic mammogram and likely benign with large area of dystrophic calcification.  Repeat recommended in 6 months which will be due in June.  Order for this is in the chart    She was advised to call with questions, concerns or new symptoms prior to her next scheduled office visit.    Thank you for allowing me to participate in the care of this pleasant patient.  Sincerely,     Everlene Other MD

## 2023-11-26 ENCOUNTER — Encounter: Payer: Self-pay | Admitting: Women's Health

## 2023-11-26 ENCOUNTER — Other Ambulatory Visit: Payer: Self-pay

## 2023-11-26 ENCOUNTER — Ambulatory Visit: Payer: Medicare (Managed Care) | Attending: Women's Health | Admitting: Women's Health

## 2023-11-26 VITALS — BP 143/70 | HR 80 | Temp 97.3°F | Wt 155.9 lb

## 2023-11-26 DIAGNOSIS — C50412 Malignant neoplasm of upper-outer quadrant of left female breast: Secondary | ICD-10-CM | POA: Insufficient documentation

## 2023-11-26 DIAGNOSIS — Z170 Estrogen receptor positive status [ER+]: Secondary | ICD-10-CM | POA: Insufficient documentation

## 2023-11-26 NOTE — Progress Notes (Signed)
 Comprehensive Breast Care at [facility]  Patient Name: Christina [name]  MRN: [id number]  DOB: [birth year]  Date of Service: 11/26/2023  Provider Name: Pieter Partridge, NP  ?   CC: transfer of care     HPI:  Christina [name] is a 78 y.o.  patient who presents today to transfer her left breast cancer surveillance from [facility] to [facility]. She completed neoadjuvant chemotherapy and is now 9 months s/p left lumpectomy and SLNB with Dr. Corrie Dandy at [facility] on 02/18/23. This was followed by left breast and regional lymph node radiation and continues on verzenio, letrozole and zometa (Dr. Marjory Lies). She also has a remote history of right breast cancer diagnosed and treated in 2011 in [place] T2 N1a ER/PR- HER2+ treated with surgery followed by [facility] and breast RT.     Today, she feels well. She denies any new breast concerns. [name] is here to establish care and continue her breast surveillance with Dr. Corrie Dandy and her team at [facility].       ONC HX:   - Infiltrating ductal carcinoma of left breast- abnormality in the left breast and axilla screen detected led to diagnostic studies and image guided biopsies-workup was done at [facility] in [place]  - Biopsies confirmed IDC in the left breast ER positive, PR low, HER2/neu 2+ on IHC FISH negative  - Metastatic involvement in the lymph node confirmed     10/07/2022 - 12/23/2022 Neoadjuvant Systemic Therapy   Dose Dense AC to Dose Dense PACLitaxel  Treatment stopped after 2 cycles of paclitaxel due to concern for cardiotoxicity    10/07/2022 - 12/23/2022 Systemic Therapy   Dose dense AC x 4 followed by dose dense Taxol treatment stopped after 2 cycles due to toxicity     ?02/18/2023 Surgery   Left breast lumpectomy and lymph node dissection    Final pathology revealed residual carcinoma and involvement of lymph nodes-7 mm residual tumor in the breast. 2 out of 8 lymph nodes with metastatic disease, 8 mm and 10 mm respectively with ENE  ER positive PR positive HER2/neu negative  ypT1b N1a    03/19/2023 - Systemic  Therapy   Letrozole    04/21/2023 - 06/05/2023 Radiation   RT to the left breast with tumor bed boost to a dose of 60.4 Gray in 33 fractions and regional lymph nodes to a dose of 50.4 Gray in 28 fractions from 04/21/2023 to 06/05/2023.     06/23/2023 - Systemic Therapy   Verzenio 150 mg twice daily  Letrozole to continue  Zometa 4 mg every 6 months     IMAGING:  08/27/23 Bilateral mammogram + [place] [facility]):  Findings: There are scattered areas of fibroglandular density (Density Category B).     Right breast:Postsurgical changes of the central breast with large area of progressive dystrophic calcification.     Left breast:There is no suspicious dominant mass or suspicious microcalcifications. Postsurgical changes of the upper outer breast at posterior depth.     Patient underwent same-day breast ultrasound evaluation, which is reported separately.     Impression: No mammographic evidence of malignancy     OVERALL ASSESSMENT:BI-RADS 3  Probably Benign     Recommended follow-up: Six month follow up         ?PAST MEDICAL HISTORY:  Past Medical History:   Diagnosis Date    Diabetes     High blood pressure        MEDICATIONS:  Current Outpatient Medications   Medication Sig    abemaciclib (  VERZENIO) 150 MG TABS tablet Take 1 tablet (150 mg total) by mouth 2 times daily.    aspirin 81 mg EC tablet Take 1 tablet (81 mg total) by mouth daily.    EPINEPHrine (EPIPEN) 0.3 mg/0.3 mL auto-injector Inject 0.3 mLs (0.3 mg total) into the muscle once as needed.    ERGOCALCIFEROL 1.25 MG (50000 UT) capsule Take 1 capsule (50,000 units total) by mouth once a week.    letrozole (FEMARA) 2.5 mg tablet Take 1 tablet (2.5 mg total) by mouth daily.    metFORMIN 500 mg 24 hr tablet Take 1 tablet (500 mg total) by mouth daily (with breakfast).    potassium chloride SA (KLOR-CON M20) 20 mEq  tablet Take 1 tablet (20 mEq total) by mouth daily.    benzonatate (TESSALON) 200 mg capsule Take 1 capsule (200 mg total) by mouth 3 times daily as needed for  Cough    blood glucose (ONETOUCH VERIO) test strip TEST BLOOD SUGAR ONCE PER DAY DX: E11.9    hydroCHLOROthiazide (HYDRODIURIL) 25 mg tablet Take 1 tablet (25 mg total) by mouth daily.    amLODIPine (NORVASC) 5 mg tablet Take 1 tablet (5 mg total) by mouth daily.    potassium chloride (KLOR-CON) 20 MEQ packet Take 20 mEq by mouth 2 times daily.    semaglutide, 0.25 or 0.5 mg/dose, (OZEMPIC) pen Inject 0.75 mLs (0.5 mg total) into the skin once a week (Patient not taking: Reported on 11/26/2023)     No current facility-administered medications for this visit.       ALLERGIES:  Allergies[1]    FAMILY HISTORY:  Cancer-related family history includes Kidney cancer in her brother.    SOCIAL HISTORY:  - she moved to [place] to be closer to her daughter who is a gynecologist and her son in law who is a Systems developer at [employer]   - looking forward to a family trip in [place] in the coming weeks     REVIEW OF SYSTEMS: Comprehensive ROS was taken on the patient intake form and reviewed with the patient.  Pertinent items are noted in HPI.    PHYSICAL EXAMINATION:  Vitals: BP 143/70   Pulse 80   Temp 36.3 C (97.3 F) (Temporal)   Wt 70.7 kg (155 lb 13.8 oz)   SpO2 100%   BMI 26.34 kg/m   Gen:  Well nourished, well developed female.  No acute distress.  Lymphatics:  No cervical, supraclavicular or axillary adenopathy bilaterally.   Breast Exam: The patient is examined in the sitting and supine positions with arms overhead and by side.  Left breast with well healed IMF and SLNB scars. Right breast s/p central lumpectomy, NAC is surgically absent. Palpable fat necrosis at central right breast (imaged).   Extremities: extremities normal, atraumatic, no cyanosis or edema. There is not evidence of lymphedema. ROM is within normal limits  Pulses: 2+ and symmetric  Skin: Skin color, texture, turgor normal. No rashes or lesions  Neurologic: Grossly normal      ASSESSMENT AND PLAN:  Marc Sivertsen is a 78 y.o. female presenting today  to establish breast surveillance at [facility]. She was a patient of Dr. Corrie Dandy at [facility]. Equilla has a history of bilateral breast cancer- left breast most recently treated with NAC, surgery, RT, and now on ET.     1. CLINICAL EXAM - without worrisome or concerning findings. No evidence of local or regional disease.     2. IMAGING - 02/2024 mammogram with standing [facility]  order at [facility]    3. BREAST EXAMS - Recommend q6-72m breast exams, alternate with medical oncology/another primary provider    4. FOLLOW UP -  03/2024 for clinical breast exam, sooner with any concerns   - Medical oncology: [facility]  - Survivorship transition: 02/2025   - Radiation oncology: [facility]     Patient was seen with Dr. Kathe Becton agrees with this plan of care and all of her questions were answered. She is aware to contact the office should any questions or concerns arise.     Total time spent on 11/26/2023 for this visit in face-to-face and non face-to-face time reviewing, obtaining, and documenting clinical information, coordinating with the care team and other specialists, and counseling the patient: 45 minutes.       Pieter Partridge, WHNP-BC, CBCN         [1]   Allergies  Allergen Reactions    Lisinopril Other (See Comments)     angioedema    Acetaminophen Hives

## 2024-01-01 NOTE — Progress Notes (Addendum)
 [address]  [address]  [place] [address]  Phone: [phone]     [facility]  Follow-up Visit   NAME:  Christina [name]  MRN:  [id number]    DOB:  1945-12-09  Date:  01/04/2024   Collapsed details  Oncology History <redacted file path>   HemOnc History Overview Note   Personal history of contralateral/right-sided breast cancer 2011 diagnosed and treated in [place] -stage T2 N1a, ER/PR negative HER2 positive-treated with surgery followed by [facility] chemotherapy followed by breast RT and 1 year of Herceptin     Infiltrating ductal carcinoma of left breast (CMS HCC Code)   09/15/2022 Initial Diagnosis    Infiltrating ductal carcinoma of left breast-new abnormality in the left breast and axilla screen detected led to diagnostic studies and image guided biopsies-workup was done at [facility] in [place]   Biopsies confirmed IDC in the left breast ER positive, PR low, HER2/neu 2+ on IHC FISH negative  Metastatic involvement in the lymph node confirmed      Other    Oncotype DX score-28     09/15/2022 Cancer Staged    CT chest abdomen pelvis 07/30/2022-no metastatic disease  Bone scan 10/17/2022-no evidence of osseous metastatic disease  Breast MRI irregular 25 x 13 x 23 mm mass in the left breast upper outer quadrant at 2:00 corresponding to biopsy-proven IDC.  Linear enhancement measuring 16 mm from the inferior aspect of the index mass-overall involved area 33 mm.  Multiple abnormal lymph nodes of varying size in the left axilla.  Largest abnormal node measuring 31 mm  Right breast postlumpectomy scar no suspicious right axillary adenopathy.  Incidental note of liver masses probably cysts(no liver mass on CT)     10/07/2022 - 12/23/2022 Systemic Therapy    Neoadjuvant  Dose Dense AC to Dose Dense PACLitaxel  Treatment stopped after 2 cycles of paclitaxel due to concern for cardiotoxicity     10/07/2022 - 12/23/2022 Systemic Therapy    Dose dense AC x 4 followed by dose dense Taxol treatment stopped  after 2 cycles due to toxicity     02/18/2023 Surgery    Left breast lumpectomy and lymph node dissection    Final pathology revealed residual carcinoma and involvement of lymph nodes-7 mm residual tumor in the breast.  2 out of 8 lymph nodes with metastatic disease, 8 mm and 10 mm respectively with ENE  ER positive PR positive HER2/neu negative  ypT1b N1a     03/19/2023 -  Systemic Therapy    Letrozole  Will add Verzenio and Zometa after completion of RT     04/21/2023 - 06/05/2023 Radiation    RT to the left breast with tumor bed boost to a dose of 60.4 Gray in 33 fractions and regional lymph nodes to a dose of 50.4 Gray in 28 fractions from 04/21/2023 to 06/05/2023.      06/23/2023 -  Systemic Therapy     Verzenio 150 mg twice daily  Letrozole to continue  Zometa 4 mg every 6 months     Breast cancer of upper-outer quadrant of left female breast (CMS HCC Code)   Malignant neoplasm of upper-outer quadrant of female breast, unspecified estrogen receptor status, unspecified laterality (CMS HCC Code)   Breast cancer (CMS HCC Code)           Current Treatment:   Verzenio 150 mg PO twice daily  Letrozole PO daily since 06/2023  Zometa 4 mg every 6 months cycle 2 day  1 next month  Subjective     History of Present Illness and ROS:   The patient comes in today for 1 month follow up visit. They are a known patient of Dr.Kirtani.  She is tolerating the treatment well. She is complaining of new onset of right sided shoulder pain. She denies abdominal pain, appetite changes, chest pain (non-pleuritic), constipation, diarrhea, fatigue, fever and chills, nausea, shortness of breath, and vomiting.     Patient stating she is doing well since last OV. She traveled to [place] with family and had a wonderful time.    Denies any new side effects from letrozole or verzenio. Is taking both of them daily without any missed doses. Has noticed the last few days that she has new onset of right sided shoulder pain. No history of trauma. Pt  requesting a shoulder xray to assess. States she knows med/onc will not be able to follow up with a shoulder xray and agreeable to go to ortho or PCP with any further issues. Thought she was due for Zometa today, but is not due until next month. Pt requesting zometa is after her bday as she would rather not have the chance of becoming ill around her bday    Patient History <redacted file path>: Past medical, family & social history was reviewed and appropriate updates were made in the EMR.     Medications <redacted file path>:   Current Outpatient Medications   Medication Sig   . abemaciclib (VERZENIO) 150 mg Oral Tab Take 1 tablet by mouth 2 (two) times a day.   SABRA amLODIPine 5 MG Oral tablet Take 1 tablet by mouth daily.   SABRA aspirin 81 MG Oral EC tablet Take 1 tablet by mouth daily.   . ergocalciferol (VITAMIN D2) 1,250 mcg (50,000 unit) Oral capsule Take 1 capsule by mouth once a week. Indications: low vitamin D levels   . hydroCHLOROthiazide 25 MG Oral tablet Take 1 tablet by mouth daily.   SABRA KLOR-CON M20 20 mEq Oral tablet TAKE 1 TABLET BY MOUTH EVERY DAY   . letrozole 2.5 mg Oral tablet Take 1 tablet by mouth daily.   . metFORMIN 500 MG Oral 24 hr tablet Take 1 tablet by mouth daily with breakfast for 134 days.   . mometasone 0.1 % Top cream Apply 1 Application topically 2 (two) times a day.          09/22/2023     3:11 PM 10/26/2023     2:44 PM 01/04/2024     1:52 PM   ECOG, NCI CTC   ECOG Performance Status 1 1 1    Peripheral Sensory Neuropathy  Grade 1    Neutropenia Grade 2 Grade 1    Anemia Grade 2 Grade 1 Grade 1         Objective:     Physical Exam:   CONSTITUTIONAL: Blood pressure (!) 143/71, pulse 70, temperature 36.1 C (97 F), temperature source Temporal, weight 70 kg (154 lb 4.8 oz), SpO2 99%.  GENERAL: Age appropriate female who is not in acute distress.   NOSE, MOUTH/THROAT: Moist mucous membranes without mucositis and thrush. There is no petechia.    LYMPHATIC:  No cervical, supraclavicular or  axillary adenopathy.    RESPIRATORY: Clear to auscultation bilaterally.   CARDIOVASCULAR: regular heart sounds. S1, S2, no murmur, rub or gallop appreciated   GASTROINTESTINAL: non-distended, nontender. There is no hepatosplenomegaly.   NEUROLOGIC: Alert & communicative. No gross motor deficit. Speech and Gait normal.  INTEGUMENTARY: No rashes.  EXTREMITIES: No pedal edema. No calf pain  PSYCHOSOCIAL: Mood, judgment, affect and insight are intact.      Results/Data <redacted file path>: I reviewed the following labs today   Hematology:  Lab Results   Component Value Date    WBCR 3.0 (L) 01/04/2024    HGB 10.5 (L) 01/04/2024    HCT 31 (L) 01/04/2024    MCV 100 01/04/2024    PLTR 182 01/04/2024     Lab Results   Component Value Date    NEUAR 2.3 01/04/2024    LYMAR 0.4 (L) 01/04/2024    Chemistries:  Lab Results   Component Value Date    NA 139 01/04/2024    K 3.6 01/04/2024    GLU 251 (H) 01/04/2024    CA 9.0 01/04/2024    BUN 24 (H) 01/04/2024    CREAT 0.9 01/04/2024    ALB 3.9 01/04/2024    ALT 19 (L) 01/04/2024    AST 12 01/04/2024    TBIL 0.5 01/04/2024    ALK 88 01/04/2024        Imaging: I reviewed the following results today   None requested for today's visit     Assessment/Plan <redacted file path> (Pt Intr & Followup <redacted file path>):   78 y.o. female with personal history of contralateral/right-sided breast cancer 2011 diagnosed and treated in [place] -stage T2 N1a, ER/PR negative HER2 positive-treated with surgery followed by [facility] chemotherapy followed by breast RT and 1 year of Herceptin. Now diagnosed with Infiltrating ductal carcinoma of left breast-new abnormality in the left breast and axilla screen detected led to diagnostic studies and image guided biopsies-workup was done at [facility] in [place]   Biopsies confirmed IDC in the left breast ER positive, PR low, HER2/neu 2+ on IHC FISH negative  Metastatic involvement in the lymph node confirmed. Onco type 28.     1. Malignant  neoplasm of left female breast, unspecified estrogen receptor status, unspecified site of breast (CMS HCC Code) (Primary)  2. Encounter for therapeutic drug monitoring  Patient seems to be tolerating treatment well  CBC and CMP reviewed with the patient  Grade 1 anemia, stable, Denies bleeding from any source  BG elevated at 251, patient states she ate prior to coming for labs today. Will continue to monitor at each visit  No other abnormal lab values that would change treatment at this time  Patient potassium 3.6, pt states she is not taking her potassium daily, will try to remember moving fwd  Will order shoulder xray for acute shoulder pain. Pt to follow up with PCP or ortho for any further issues with the shoulder as it is most likely not medical oncology related  No refills needed at this time  Scheduling zometa infusion for next month after the pt birthday    PLAN:  Continue with letrozole an verzenio  Zometa infusion due next month  Will order shoulder xray, pt to follow up with ortho or PCP for further issues with the shoulder   Pt to try and remember to take potassium daily    Follow up:  - 1 month or earlier if necessary.   - She was advised to call with questions, concerns or new symptoms prior to her next scheduled office visit.       ADDENDUM: Pt right should xray resulting as Degenerative changes with probable calcific tendinopathy. Pt called and updated. States I will just wait it out and see if it  gets better.      For purpose of time based billing: 28 minutes included face-to-face time and non-face-to-face time on the date of service by the provider. Time does not include separately billable services.    Thank you for allowing me to participate in the care of this pleasant patient.     Electronically signed by Nat Boards, AGNP-C

## 2024-01-04 LAB — UNMAPPED LAB RESULTS
Basophil # (HT): 0 10 3/uL (ref 0.0–0.2)
Basophil % (HT): 1 % (ref 0–3)
Eosinophil # (HT): 0.1 10 3/uL (ref 0.0–0.6)
Eosinophil % (HT): 3 % (ref 0–5)
Hematocrit (HT): 31 % — ABNORMAL LOW (ref 35–47)
Hemoglobin (HGB) (HT): 10.5 g/dL — ABNORMAL LOW (ref 12.0–16.0)
Lymphocyte # (HT): 0.4 10 3/uL — ABNORMAL LOW (ref 1.0–4.8)
Lymphocyte % (HT): 13 % — ABNORMAL LOW (ref 15–45)
MCHC (HT): 33.9 g/dL (ref 31.0–37.5)
MCV (HT): 100 fL (ref 80–100)
Mean Corpuscular Hemoglobin (MCH) (HT): 33.9 pg (ref 26.0–34.0)
Monocyte # (HT): 0.2 10 3/uL (ref 0.1–1.0)
Monocyte % (HT): 8 % (ref 0–15)
Neutrophil # (HT): 2.3 10 3/uL (ref 1.8–8.0)
Platelets (HT): 182 10 3/uL (ref 150–450)
RBC (HT): 3.1 10 6/uL — ABNORMAL LOW (ref 3.80–5.20)
RDW (HT): 12.7 % (ref 0.0–15.2)
Seg Neut % (HT): 75 % (ref 45–75)
WBC (HT): 3 10 3/uL — ABNORMAL LOW (ref 4.0–11.0)

## 2024-02-02 LAB — UNMAPPED LAB RESULTS
Basophil # (HT): 0 10 3/uL (ref 0.0–0.2)
Basophil % (HT): 1 % (ref 0–3)
Eosinophil # (HT): 0.1 10 3/uL (ref 0.0–0.6)
Eosinophil % (HT): 3 % (ref 0–5)
Hematocrit (HT): 31 % — ABNORMAL LOW (ref 35–47)
Hemoglobin (HGB) (HT): 10.6 g/dL — ABNORMAL LOW (ref 12.0–16.0)
Lymphocyte # (HT): 0.5 10 3/uL — ABNORMAL LOW (ref 1.0–4.8)
Lymphocyte % (HT): 15 % (ref 15–45)
MCHC (HT): 33.9 g/dL (ref 31.0–37.5)
MCV (HT): 98 fL (ref 80–100)
Mean Corpuscular Hemoglobin (MCH) (HT): 33.2 pg (ref 26.0–34.0)
Monocyte # (HT): 0.3 10 3/uL (ref 0.1–1.0)
Monocyte % (HT): 8 % (ref 0–15)
Neutrophil # (HT): 2.2 10 3/uL (ref 1.8–8.0)
Platelets (HT): 219 10 3/uL (ref 150–450)
RBC (HT): 3.19 10 6/uL — ABNORMAL LOW (ref 3.80–5.20)
RDW (HT): 12.3 % (ref 0.0–15.2)
Seg Neut % (HT): 73 % (ref 45–75)
WBC (HT): 3 10 3/uL — ABNORMAL LOW (ref 4.0–11.0)

## 2024-02-02 NOTE — Progress Notes (Addendum)
 Follow-up Visit   NAME:  Christina [name]  MRN:  [id number]    DOB:  1946-03-01  Date:  02/02/2024   Collapsed details  Oncology History <redacted file path>   HemOnc History Overview Note   Personal history of contralateral/right-sided breast cancer 2011 diagnosed and treated in [place] -stage T2 N1a, ER/PR negative HER2 positive-treated with surgery followed by [facility] chemotherapy followed by breast RT and 1 year of Herceptin     Infiltrating ductal carcinoma of left breast (CMS HCC Code)   09/15/2022 Initial Diagnosis    Infiltrating ductal carcinoma of left breast-new abnormality in the left breast and axilla screen detected led to diagnostic studies and image guided biopsies-workup was done at [facility] health in [place]   Biopsies confirmed IDC in the left breast ER positive, PR low, HER2/neu 2+ on IHC FISH negative  Metastatic involvement in the lymph node confirmed      Other    Oncotype DX score-28     09/15/2022 Cancer Staged    CT chest abdomen pelvis 07/30/2022-no metastatic disease  Bone scan 10/17/2022-no evidence of osseous metastatic disease  Breast MRI irregular 25 x 13 x 23 mm mass in the left breast upper outer quadrant at 2:00 corresponding to biopsy-proven IDC.  Linear enhancement measuring 16 mm from the inferior aspect of the index mass-overall involved area 33 mm.  Multiple abnormal lymph nodes of varying size in the left axilla.  Largest abnormal node measuring 31 mm  Right breast postlumpectomy scar no suspicious right axillary adenopathy.  Incidental note of liver masses probably cysts(no liver mass on CT)     10/07/2022 - 12/23/2022 Systemic Therapy    Neoadjuvant  Dose Dense AC to Dose Dense PACLitaxel  Treatment stopped after 2 cycles of paclitaxel due to concern for cardiotoxicity     10/07/2022 - 12/23/2022 Systemic Therapy    Dose dense AC x 4 followed by dose dense Taxol treatment stopped after 2 cycles due to toxicity     02/18/2023 Surgery    Left breast lumpectomy and lymph node  dissection    Final pathology revealed residual carcinoma and involvement of lymph nodes-7 mm residual tumor in the breast.  2 out of 8 lymph nodes with metastatic disease, 8 mm and 10 mm respectively with ENE  ER positive PR positive HER2/neu negative  ypT1b N1a     03/19/2023 -  Systemic Therapy    Letrozole  Will add Verzenio and Zometa after completion of RT     04/21/2023 - 06/05/2023 Radiation    RT to the left breast with tumor bed boost to a dose of 60.4 Gray in 33 fractions and regional lymph nodes to a dose of 50.4 Gray in 28 fractions from 04/21/2023 to 06/05/2023.      06/23/2023 -  Systemic Therapy     Verzenio 150 mg twice daily  Letrozole to continue  Zometa 4 mg every 6 months     Breast cancer of upper-outer quadrant of left female breast (CMS HCC Code)   Malignant neoplasm of upper-outer quadrant of female breast, unspecified estrogen receptor status, unspecified laterality (CMS HCC Code)   Breast cancer (CMS HCC Code)         Subjective     History of Present Illness and ROS:   Christina [name] comes in for follow-up visit.  She is doing really well.  Is currently on Verzenio and letrozole.  Also received a dose of Zometa.  Due for second dose scheduled for Thursday.  Initially treatment tolerance was excellent but now experiencing progressive fatigue.  No diarrhea.  She does have quite a bit of nausea and tells me that it is becoming difficult to take the second dose of Verzenio in the evenings.  She has tried taking ondansetron prior to taking the Verzenio and she has tried spacing it out to every 12 hours but at least twice a week she is not able to keep it down.  This was an occasional problem before but in the past month or so becoming more constant.     Otherwise she has mild myelosuppression not requiring interruption in therapy.      Did well with Zometa also with flulike symptoms but recovered promptly.    Coming up for mammogram in June.  Already scheduled        Patient History <redacted file path>:  Past medical, family & social history was reviewed in the EMR.     Medications <redacted file path>:  Current Outpatient Medications   Medication Sig   . abemaciclib (VERZENIO) 150 mg Oral Tab Take 1 tablet by mouth 2 (two) times a day.   SABRA amLODIPine 5 MG Oral tablet Take 1 tablet by mouth daily.   SABRA aspirin 81 MG Oral EC tablet Take 1 tablet by mouth daily.   . ergocalciferol (VITAMIN D2) 1,250 mcg (50,000 unit) Oral capsule Take 1 capsule by mouth once a week. Indications: low vitamin D levels   . hydroCHLOROthiazide 25 MG Oral tablet Take 1 tablet by mouth daily.   SABRA KLOR-CON M20 20 mEq Oral tablet TAKE 1 TABLET BY MOUTH EVERY DAY   . letrozole 2.5 mg Oral tablet Take 1 tablet by mouth daily.   . metFORMIN 500 MG Oral 24 hr tablet Take 1 tablet by mouth daily with breakfast for 134 days.   . mometasone 0.1 % Top cream Apply 1 Application topically 2 (two) times a day.            01/04/2024     1:52 PM   ECOG, NCI CTC   ECOG Performance Status 1   Anemia Grade 1        Objective:     Physical Exam:   CONSTITUTIONAL: Blood pressure (!) 146/75, pulse 71, temperature 36.2 C (97.1 F), temperature source Temporal, weight 69.8 kg (153 lb 14.4 oz), SpO2 100%.  GENERAL: Age appropriate female who is not in acute distress.   RESPIRATORY: Clear to auscultation bilaterally.   CARDIOVASCULAR: regular heart sounds.   GASTROINTESTINAL: non-distended, nontender. There is no hepatosplenomegaly.   NEUROLOGIC: AAO x 3. No gross motor deficit. Speech and Gait normal.   EXTREMITIES: No pedal edema.   PSYCHOSOCIAL: Mood, judgment, affect and insight are intact.        Results/Data <redacted file path>: I reviewed the following labs today   Hematology:  Lab Results   Component Value Date    WBCR 3.0 (L) 02/02/2024    HGB 10.6 (L) 02/02/2024    HCT 31 (L) 02/02/2024    MCV 98 02/02/2024    PLTR 219 02/02/2024     Lab Results   Component Value Date    NEUAR 2.2 02/02/2024    LYMAR 0.5 (L) 02/02/2024              Chemistries:  Lab  Results   Component Value Date    NA 139 02/02/2024    K 3.5 02/02/2024    GLU 212 (H) 02/02/2024    CA 9.1 02/02/2024  BUN 22 (H) 02/02/2024    CREAT 0.7 02/02/2024    ALB 4.3 02/02/2024    ALT 19 (L) 02/02/2024    AST 14 02/02/2024    TBIL 0.5 02/02/2024    ALK 102 02/02/2024        Imaging: I reviewed the following results today   Mammogram 08/27/2023  Findings: There are scattered areas of fibroglandular density (Density Category B).      Right breast:Postsurgical changes of the central breast with large area of progressive dystrophic calcification.     Left breast:There is no suspicious dominant mass or suspicious microcalcifications. Postsurgical changes of the upper outer breast at posterior depth.     Patient underwent same-day breast ultrasound evaluation, which is reported separately.      Impression: No mammographic evidence of malignancy     OVERALL ASSESSMENT:BI-RADS 3  Probably Benign     Recommended follow-up: Six month follow up     Results were given to the patient today at the time of the appointment.     Signed by Attending: Justino Colorado, MD on 08/27/2023 10:30 AM     Assessment/Plan <redacted file path> (Pt Intr & Followup <redacted file path>):   78 y.o. female with  cT2 N1/N2 left sided ER positive PR low HER2 negative (2+ IHC negative by FISH) IDC  (personal history of T2 N1 ER negative HER2 positive right breast cancer in 2011 s/p surgery RT,TCH /Herceptin))    Left breast cancer ER/PR positive HER2/neu negative adjuvant therapy  Initially seemed to be doing really well on letrozole and Verzenio.  No issues on letrozole but is experiencing progressive fatigue and worsening nausea on Verzenio.  It is difficult for her to keep the second dose down due to nausea despite taking antiemetic prior.  This is not entirely new but is getting more frequent and happening 2-3 times a week now.    She has tried a lot of strategies such as spacing the doses, eating something, taking antiemetic prior etc.   None of this has helped.  Therefore we discussed lowering the dose to 100 mg twice daily.  This is preferable to multiple treatment interruptions.  We can try at current dose for another month and if the problem is not resolving then we will order lower dose of 100 twice daily.    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  Repeat recommended in 6 months which will be due in June.      She was advised to call with questions, concerns or new symptoms prior to her next scheduled office visit.     Follow-up in 3 months    Thank you for allowing me to participate in the care of this pleasant patient.  Sincerely,     Derl Kurtz MD

## 2024-03-02 ENCOUNTER — Inpatient Hospital Stay: Admit: 2024-03-02 | Discharge: 2024-03-02 | Disposition: A | Discharge status: Home / Self Care | Payer: Self-pay

## 2024-03-09 NOTE — Progress Notes (Signed)
 Chief Complaint Chief Complaint   Patient presents with   . Follow-up            Visit History for  03/09/2024   Problem List <redacted file path>* Health Maintenance <redacted file path>* Results/Data <redacted file path>*  Review Flowsheets <redacted file path>*   History of Present Illness  HPI   Pleasant 78 year old female presents to the office today for follow-up.  Patient has a past medical history significant for primary nature tension type diabetes hyperlipidemia and left breast cancer.  Patient is doing well overall.  She is compliant with her medication regimen.  She follows up with endocrinology and has labs ordered by their team.  Tolerating the metformin well without any significant GI upset.  Sees oncology regularly.  Mammogram up-to-date.    Social History     Tobacco Use   . Smoking status: Never     Passive exposure: Past   . Smokeless tobacco: Never   Substance Use Topics   . Alcohol use: Yes     Comment: 2 glasses of wine a month   . Drug use: Never     family history includes Coronary artery disease in her brother; Coronary artery disease (age of onset: 44) in her father; Diabetes in her brother; Heart failure in her mother; Hypertension in her brother and mother; Kidney cancer (age of onset: 62) in her brother.   Review of Systems   Respiratory:  Negative for shortness of breath.    Cardiovascular:  Negative for chest pain and palpitations.   Neurological:  Negative for dizziness and headaches.           Medications <redacted file path> and  Allergies <redacted file path>     Current Outpatient Medications   Medication Instructions   . abemaciclib (VERZENIO) 150 mg Oral Tab 1 tablet, Oral, 2 times daily   . amLODIPine (NORVASC) 5 mg, Oral, DAILY   . aspirin 81 mg, Oral, DAILY   . ergocalciferol (VITAMIN D2) 50,000 Units, Oral, WEEKLY SCHEDULED   . hydroCHLOROthiazide 25 mg, Oral, DAILY   . KLOR-CON M20 20 mEq Oral tablet 20 mEq, Oral, Daily   . letrozole (FEMARA) 2.5 mg, Oral, DAILY   .  metFORMIN (GLUCOPHAGE-XR) 500 mg, Oral, DAILY WITH BREAKFAST   . mometasone 0.1 % Top cream 1 Application, Topical, 2 times daily      Allergies   Allergen Reactions   . Lisinopril Shortness Of Breath/Wheezing and Other (See Comments)     Palpitations    . Acetaminophen Hives   . Hay Fever And Allergy Relief Other (See Comments)     sneezing & scratchy throat   . Ezetimibe Other (See Comments)     Possible cause of angioedema    . Statins-Hmg-Coa Reductase Inhibitors Other (See Comments)     Tried 2 in the past with severe myalgias and arthralgias       Objective:     Vitals  Modify Vitals <redacted file path>  height is 5' 5 (1.651 m) and weight is 66.7 kg (147 lb). Her blood pressure is 134/72 and her pulse is 64. Her oxygen saturation is 100%.  Body mass index is 24.46 kg/m.       Physical Exam  Cardiovascular:      Rate and Rhythm: Normal rate and regular rhythm.      Heart sounds: Normal heart sounds.   Pulmonary:      Effort: Pulmonary effort is normal.      Breath  sounds: Normal breath sounds.   Neurological:      Mental Status: She is alert.   Psychiatric:         Mood and Affect: Mood normal.         Thought Content: Thought content normal.             Assessment/Plan <redacted file path>   Clinical References <redacted file path>*   Pt Intr & Followup <redacted file path>*   Wrap Up <redacted file path>*     Issues Addressed/ Plan <redacted file path>     1. Primary hypertension  -Blood pressure is very well-controlled at this time.  She will continue with the amlodipine 5 mg daily and hydrochlorothiazide 25 mg daily.  Electrolytes historically have been stable.  She will repeat labs done prior to appoint with endocrinology    2. Type 2 diabetes mellitus without complication, without long-term current use of insulin  -Most recent A1c was at 7.2.  Continue metformin daily.  He is going to schedule eye exam.  Sees endocrinology next month    3. Mixed hyperlipidemia  -She has not had a lipid panel checked  in quite some time.  Currently not on statin however in the setting of her type 2 diabetes she would benefit. previously tried possibly atorvastatin and rosuvastatin but had myalgias and stopped. Fasting lipid panel as ordered by endocrinology.     4. Infiltrating ductal carcinoma of left breast (CMS HCC Code)  -Mammogram up-to-date, on Verzenio and letrozole.  Following up with oncology         Follow up  recommended <redacted file path> Return in about 6 months (around 09/09/2024).     Patient verbalized understanding of the instructions.       Arleta Custard, PA    Please note that portions of this note has been electronically dictated using Chief Financial Officer.

## 2024-03-18 LAB — UNMAPPED LAB RESULTS
Basophil # (HT): 0 10 3/uL (ref 0.0–0.2)
Basophil % (HT): 1 % (ref 0–3)
Eosinophil # (HT): 0 10 3/uL (ref 0.0–0.6)
Eosinophil % (HT): 1 % (ref 0–5)
Hematocrit (HT): 30 % — ABNORMAL LOW (ref 35–47)
Hemoglobin (HGB) (HT): 10.3 g/dL — ABNORMAL LOW (ref 12.0–16.0)
Lymphocyte # (HT): 0.5 10 3/uL — ABNORMAL LOW (ref 1.0–4.8)
Lymphocyte % (HT): 19 % (ref 15–45)
MCHC (HT): 34.6 g/dL (ref 31.0–37.5)
MCV (HT): 97 fL (ref 80–100)
Mean Corpuscular Hemoglobin (MCH) (HT): 33.6 pg (ref 26.0–34.0)
Monocyte # (HT): 0.3 10 3/uL (ref 0.1–1.0)
Monocyte % (HT): 9 % (ref 0–15)
Neutrophil # (HT): 1.8 10 3/uL (ref 1.8–8.0)
Platelets (HT): 174 10 3/uL (ref 150–450)
RBC (HT): 3.07 10 6/uL — ABNORMAL LOW (ref 3.80–5.20)
RDW (HT): 12.8 % (ref 0.0–15.2)
Seg Neut % (HT): 69 % (ref 45–75)
WBC (HT): 2.7 10 3/uL — ABNORMAL LOW (ref 4.0–11.0)

## 2024-03-21 ENCOUNTER — Telehealth: Payer: Self-pay | Admitting: Women's Health

## 2024-03-21 NOTE — Telephone Encounter (Signed)
 Pt returning a call. Not sure what it was regarding. Request call back

## 2024-03-22 NOTE — Telephone Encounter (Signed)
 Spoke with patient and informed her that it does not appear someone from our office called her yesterday or anyone else through [facility]. Writer informed her she does have an appointment with our office on 03/24/24 at 10 am at [facility]. Patient requested appointment information be sent in mychart message, mychart message was sent.

## 2024-03-24 ENCOUNTER — Encounter: Payer: Self-pay | Admitting: Women's Health

## 2024-03-24 ENCOUNTER — Ambulatory Visit: Payer: Medicare (Managed Care) | Admitting: Women's Health

## 2024-03-24 ENCOUNTER — Telehealth: Payer: Self-pay

## 2024-03-24 ENCOUNTER — Other Ambulatory Visit: Payer: Self-pay

## 2024-03-24 VITALS — BP 142/65 | HR 74 | Temp 96.8°F | Resp 18 | Wt 154.1 lb

## 2024-03-24 DIAGNOSIS — Z170 Estrogen receptor positive status [ER+]: Secondary | ICD-10-CM

## 2024-03-24 DIAGNOSIS — C50412 Malignant neoplasm of upper-outer quadrant of left female breast: Secondary | ICD-10-CM

## 2024-03-24 NOTE — Addendum Note (Signed)
 Addended by: SUDIE PIMENTA on: 03/24/2024 03:07 PM     Modules accepted: Level of Service

## 2024-03-24 NOTE — Progress Notes (Signed)
 [facility] at [name]  Patient Name: Christina [name]  MRN: [id number]  DOB: Jun 17, 1946  Date of Service: 03/24/2024  Provider Name: Rufus Gaudier, NP  Christina [name], [place]   ?   CC: routine surveillance     HPI:  Christina [name] is a lovely 78 y.o.  patient who presents today for left breast cancer surveillance. I last saw her 11/26/23 to transfer her care from [facility] to [name]. She completed neoadjuvant chemotherapy and is now 1 year months s/p left lumpectomy and SLNB with Dr. Marea at [name] on 02/18/23. This was followed by left breast and regional lymph node radiation and continues on verzenio, letrozole and zometa (Dr. Derrick). She also has a remote history of right breast cancer diagnosed and treated in 2011 in [place] T2 N1a ER/PR- HER2+ treated with surgery followed by [facility] and breast RT.     Today, she feels well. She denies any Christina breast concerns. Does report that her Verzenio will occasionally make her stomach hurt, but she reports she was told by her oncology team to skip one dose, reports she has relief with doing this. Christina [name] had a left breast mammogram at [facility] 02/2024 with no evidence of malignancy and is next due for bilateral surveillance 08/2024.       Oncology History Overview Note   Breast Cancer Risk Factors:  FAMILY HISTORY: No breast, ovarian, pancreatic or prostate cancer. No previous genetic testing; Age at Menarche:  2; OCP Use: No; Fertility Treatment?: No; G1, P1; Age at First Live Birth: 8; Total months breastfeeding: 8; Menopausal status: postmenopausal. Postmenopausal HRT: No. BMI at diagnosis: Body mass index is 25.89 kg/m.; Mammographic density: unk.  # Prior breast biopsies: 1. H/O atypia:  no; H/O breast cancer:  yes 2011 - treatment in [place]; lumpectomy w/ SLNBx, ER/PR- HER2+, treated with TC-herceptin and radiation. T2N1a. Prior thoracic radiation? no.     Breast cancer of upper-outer quadrant of left female breast   07/22/2022 Significant Radiology Findings    Screening mammo/US  [name] [place]) 07/22/22    Mammographically, there is a dense irregular mass in the axillary tail of the left breast with at least one enlarged left axillary lymph node included in the imaging field. No suspicious microcalcifications on the left.     There are stable lumpectomy changes with densely calcified fat necrosis in the central retroareolar right breast.     On targeted ultrasound, There is a 2.0 x 1.2 x 1.9 cm heterogeneously hypoechoic irregular mass with angular and indistinct margins at 2:00 6 cm from the nipple in the left breast.     There are two immediately adjacent hypoechoic lymph nodes with loss of fatty hila at the site of a palpable mass in the inferior left axilla. The larger lymph node measures 2.7 x 1.3 x 2.6 cm. More laterally in the inferior left axilla are two additional hypoechoic lymph nodes with loss of fatty hila; one of them measuring 1.5 x 1.0 x 1.5 cm.      07/28/2022 Significant Radiology Findings    CT Chest Abdomen Pelvis [facility] [place]) 07/28/22    1. Left axillary adenopathy measuring up to 1.7 cm in short axis, with an additional 1.2 by 1.1 cm mass or lymph node along the left lateral breast glandular tissue.     2. No findings of metastatic disease to the abdomen/pelvis or chest.     3. Large hiatal hernia containing stomach as well as part of the pancreatic tail and splenic  vein.     4. Prominent stool throughout the colon favors constipation.     5. Uterine fibroids.     6. Thoracolumbar scoliosis and spondylosis with degenerative disc disease in the lumbar spine.     7. Aortic and coronary artery atherosclerosis. There is atheromatous plaque at the origin of the celiac trunk and SMA dorsally without occlusion.     8. Increased peripheral calcification along the right breast lumpectomy site, likely therapy related.     9. Other imaging findings of potential clinical significance: Coronary atherosclerosis. Simple and complex renal cysts, surveillance in the context of the  patient's underlying cancer follow up imaging suggested. 2 mm left kidney lower pole nonobstructive renal calculus.      07/30/2022 Significant Lab Findings    Left breast and axillary node biopsy [facility] [place]) 07/30/22    A. Breast, left 2:00 6CMFN     Invasive ductal carcinoma with necrosis, grade 2 (tubules 3, nuclei 2, mitosis 1)    B. Breast, left "site 2"    Invasive ductal carcinoma, grade 2 (tubules 3, nuclei 2, mitosis 1)    ER positive 95% strong  PR positive 2% strong  HER2 2+ equivocal  FISH nonamplified (ratio 1.23; coy number 3.23)     09/03/2022 Initial Diagnosis    Breast cancer of upper-outer quadrant of left female breast     09/03/2022 Cancer Staged    Staging form: Breast, AJCC 8th Edition  - Clinical stage from 09/03/2022: Stage IB (cT1c, cN1(f), cM0, G2, ER+, PR+, HER2-)     09/14/2022 Significant Radiology Findings    MRI [facility])    Finding 1:  There is an irregular mass with irregular margins and homogeneous enhancement, and associated susceptibility artifact consistent with a biopsy clip measuring 25 x13 x23 (AP x TV x CC) seen in the posterior of the left breast upper outer   quadrant at 2 o'clock. Area demonstrates washout kinetics.  This corresponds to the biopsy proven IDC. There is linear enhancement anteriorly measuring 16 mm from the inferior aspect of the index mass (best seen on series 101 image 60). The overall   involved area measures approximately 33 mm.     There is 2mm of preserved fat plane between the posterior aspect of the index mass and the pectoralis muscle. There is no pectoralis muscle enhancement     Finding 2:  There are multiple abnormal lymph node of varying size in the left axilla.  There is a signal void from a biopsy clip in one of the lymph nodes which was biopsy proven metastatic disease. The largest abnormal level II axillary lymph node   measures 31 mm in short axis on axial acquisition.     There is edema along the subclavian vessels and possibly enlarged  lymph nodes in this area as well.     There are no suspicious left internal mammary lymph nodes.     Finding 3:  There is a post lumpectomy scar and signal void from foreign bodies seen in the middle third central of the right breast.     There is no suspicious right axillary or right internal mammary lymph node.     There is incidental note of several liver masses, probably cysts.      09/29/2022 -  Chemotherapy    10/07/2022 - 12/23/2022 Systemic Therapy   Neoadjuvant  Dose Dense AC to Dose Dense PACLitaxel  Treatment stopped after 2 cycles of paclitaxel due to concern for cardiotoxicity  10/07/2022 - 12/23/2022 Systemic Therapy   Dose dense AC x 4 followed by dose dense Taxol treatment stopped after 2 cycles due to toxicity     03/19/2023 - Systemic Therapy   Letrozole     06/23/2023 - Systemic Therapy   Verzenio 150 mg twice daily  Letrozole to continue  Zometa 4 mg every 6 months      02/18/2023 Surgery    Left breast lumpectomy and lymph node dissection Christina [name], Christina [name]):    Final pathology revealed residual carcinoma and involvement of lymph nodes-7 mm residual tumor in the breast. 2 out of 8 lymph nodes with metastatic disease, 8 mm and 10 mm respectively with ENE  ER positive PR positive HER2/neu negative  ypT1b N1a      04/21/2023 -  Radiation Therapy    04/21/2023 - 06/05/2023 Radiation at [name]  RT to the left breast with tumor bed boost to a dose of 60.4 Gray in 33 fractions and regional lymph nodes to a dose of 50.4 Gray in 28 fractions from 04/21/2023 to 06/05/2023.            IMAGING:  03/02/24 Bilateral diagnostic mammography  Indication: Follow-up left lumpectomy. Personal history of left breast IDC status post lumpectomy on 02/18/2023. Remote history of right lumpectomy.     Last clinical breast exam: Less than 12 months     Technique: Bilateral digital mammography with tomosynthesis was performed. This examination was interpreted by an ACR accredited radiologist and screened by a Computer Aided Detection  System.     Comparison: Prior mammograms     Findings: There are scattered areas of fibroglandular density (Density Category B). There are similar treatment changes of the left breast. There are stable surgical changes of the right breast.     Impression: Similar left breast treatment changes.     OVERALL ASSESSMENT:BI-RADS 3  Probably Benign     Recommended follow-up: Six-month follow-up left diagnostic mammogram for continued lumpectomy follow-up.     08/27/23 Bilateral mammogram + US  [facility]):  Findings: There are scattered areas of fibroglandular density (Density Category B).     Right breast:Postsurgical changes of the central breast with large area of progressive dystrophic calcification.     Left breast:There is no suspicious dominant mass or suspicious microcalcifications. Postsurgical changes of the upper outer breast at posterior depth.     Patient underwent same-day breast ultrasound evaluation, which is reported separately.     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 (cT2, cN1(f), cM0, G2, ER+, PR+, HER2-)  Site 1: Q-Clip 2:00 mass 6 cfn, accurate deployment of clip-invasive ductal carcinoma, grade 2, ER PR positive HER2 equivocal, not amplified via FISH Left axillary lymph node: Vision clip, accurate deployment -invasive ductal carcinoma, grade    Breast cancer of upper-outer quadrant of left female breast 09/03/2022    Controlled type 2 diabetes mellitus without complication 08/13/2015    Dermatochalasis of both  upper eyelids  06/27/2019    Diabetes     Essential hypertension 05/25/2014    Essential tremor 10/20/2021    High blood pressure     Hyperlipidemia 05/25/2014    PCO (posterior capsular opacification), bilateral 06/27/2019    Posterior vitreous detachment of both eyes 06/27/2019    Presbyopia of both eyes 06/27/2019    Pseudophakia of both eyes 06/27/2019    Renal cyst 02/13/2018    Respiratory obstruction 06/12/2019    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    SOCIAL HISTORY:  - she moved to [place] to be closer to her daughter who is a gynecologist and her son in law who is a Systems developer at [employer]   - enjoyed her trip to [place] with her family. Enjoys traveling- [employer]!     REVIEW OF SYSTEMS: Comprehensive ROS was taken on the patient intake form and reviewed with the patient.  Pertinent items are noted in HPI.    PHYSICAL EXAMINATION:  Vitals: BP 142/65   Pulse 74   Temp 36 C (96.8 F) (Temporal)   Resp 18   Wt 69.9 kg (154 lb 1.6 oz)   SpO2 98%   BMI 26.04 kg/m   Gen:  Well nourished, well developed female.  No acute distress.  Lymphatics:  No cervical, supraclavicular or axillary adenopathy bilaterally.   Breast Exam: The patient is examined in the sitting and supine positions with arms overhead and by side. Left breast with well healed IMF and SLNB scars. Right breast s/p central lumpectomy, NAC is surgically absent. Palpable fat necrosis at central right breast, stable.   Extremities: extremities normal, atraumatic, no cyanosis or edema. There is not evidence of lymphedema. ROM is within normal limits  Pulses: 2+ and symmetric  Skin: Skin color, texture, turgor normal. No rashes or lesions  Neurologic: Grossly normal      ASSESSMENT AND PLAN:  Christina [name] is a 78 y.o. female presenting today for routine surveillance. Christina [name] has a history  of bilateral breast cancer- left breast most recently treated with NAC, surgery, RT, and now on ET.     1. CLINICAL EXAM - without worrisome or concerning findings. No evidence of local or regional disease.     2. IMAGING - reviewed and up to date. Bilateral surveillance due 02/2025, left mammogram due [facility]/2024 at [facility]     3. BREAST EXAMS - Recommend q6-36m breast exams, alternate with medical oncology/another primary provider    4. FOLLOW UP -  03/2025 for clinical breast exam, sooner with any concerns   - Medical oncology: Dr. Derrick   - Survivorship transition: 02/2025   - Rad[facility] oncology: [facility]     Christina [name] agrees with this plan of care and all of her questions were answered. She is aware to contact the office should any questions or concerns arise.     Total time spent on 03/24/2024 for this visit in face-to-face and non face-to-face time reviewing, obtaining, and documenting clinical information, coordinating with the care team and other specialists, and counseling the patient: 30 minutes.     Patient seen and evaluated with Rufus Gaudier, WHNP-BC, CBCN    Christina [name], Christina [name]  APP Surgical Fellow         [1]   Allergies  Allergen Reactions    Lisinopril Other (See Comments)  angioedema    Acetaminophen Hives

## 2024-03-24 NOTE — Telephone Encounter (Signed)
 Per ck out note made fuv w/ rufus 03/30/25    Made mammo appt (at [address]/ per patient request) for 08/22/24    Did not make mammo for 02/2025 -   Krista at [address] said she thinks order for June needs to be screening/ not diagnostic.  When patient comes in for mamm on 08/22/24, they will make the 06/26 mamm appt for her.

## 2024-03-28 ENCOUNTER — Other Ambulatory Visit: Payer: Self-pay | Admitting: Radiology

## 2024-03-28 ENCOUNTER — Telehealth: Payer: Self-pay | Admitting: Sports Medicine

## 2024-03-28 DIAGNOSIS — M1711 Unilateral primary osteoarthritis, right knee: Secondary | ICD-10-CM

## 2024-03-28 NOTE — Telephone Encounter (Signed)
 Anthony called to request another synvisc injection for right knee  ls 4.24 can you place the order or do you need a fuv / tel visit? Thanks

## 2024-03-28 NOTE — Telephone Encounter (Signed)
 Order placed for Synvisc-1 injection

## 2024-03-30 NOTE — Patient Instructions (Addendum)
 PATIENT INSTRUCTIONSPlease Note:  You may have access to your test results in MyChart before the doctor has a chance to review them.  Some results require follow up examination or more discussion than can be reasonably expected during a phone call.  Please keep your scheduled appointment to discuss results and planning. Imaging: Mammogram: December 2025Follow Le:Mzulmw to our office: as neededPlease note you'll be given a follow up appointment today.  This is a reminder appointment only.  As we get closer your appointment time or date may change once the schedule is known.  Thank you and call with any questions.Please continue self exams every month as there are some breast cancers that can't be seen on imaging.  Call our office if you develop any new lumps, skin changes, nipple discharge or other abnormality.Breast Self Exam A self breast exam may help you find changes or problems while they are still small.  Tell your doctor right away about any changes so they can be checked. Most changes are not cancer, but should always be checked. Do a breast self exam: Every month. One week after your period (monthly cycle, menstrual period). On the first day of each month if you do not have periods anymore. LOOK FOR ANY:  Change in breast size or shape.  Change in breast color. Dimples in your breast. Changes in your nipples.   Changes in your skin.  Dry skin on your breasts or nipples. Watery or bloody discharge from your nipples.   FEEL FOR:  Lumps. Thick, hard places. Any other changes.Tell your doctor or nurse if you find any of these.   THERE ARE 3 WAYS TO DO THE BREAST SELF EXAM: 1 .Stand in front of a mirror.l Lift your arms over your head and turn side to side.l Put your hands on your hips and lean down, then turn from side to side.l Bend forward and turn from side to side. 2 .In the  shower.l With soapy hands, check both breasts. Then check above and below your collarbone and under your arms (armpits).  l Feel above and below your collarbone down to under your breast, and from the center of your chest to the outer edge of the armpit. Check for any lumps or hard spots.  l Using the tips of your middle three fingers check your whole breast by pressing your hand over your breast in a circle or in an up and down motion.   3 .Lying Down.l Lie flat on your bed.  l Put a small pillow under the breast you are going to check. On that same side, put your hand behind your head.  l With your other hand, use the 3 middle fingers to feel the breast.l Move your fingers in a circle around the breast. Press firmly over all parts of the breast to feel for any lumps. Easy-to-Read style based on content from [facility], [place], [place]  Document Released: 02/11/2008  Document Re-Released: 10/15/2010ExitCare Patient Information 2010 [place], [place].

## 2024-04-15 ENCOUNTER — Other Ambulatory Visit: Payer: Self-pay

## 2024-04-15 ENCOUNTER — Ambulatory Visit: Payer: Medicare (Managed Care) | Admitting: Sports Medicine

## 2024-04-15 VITALS — BP 169/77 | HR 64 | Ht 64.0 in | Wt 147.0 lb

## 2024-04-15 DIAGNOSIS — M1711 Unilateral primary osteoarthritis, right knee: Secondary | ICD-10-CM

## 2024-04-15 LAB — UNMAPPED LAB RESULTS
Basophil # (HT): 0 10 3/uL (ref 0.0–0.2)
Basophil % (HT): 1 % (ref 0–3)
Eosinophil # (HT): 0 10 3/uL (ref 0.0–0.6)
Eosinophil % (HT): 2 % (ref 0–5)
Hematocrit (HT): 30 % — ABNORMAL LOW (ref 35–47)
Hemoglobin (HGB) (HT): 10.1 g/dL — ABNORMAL LOW (ref 12.0–16.0)
Lymphocyte # (HT): 0.5 10 3/uL — ABNORMAL LOW (ref 1.0–4.8)
Lymphocyte % (HT): 18 % (ref 15–45)
MCHC (HT): 33.8 g/dL (ref 31.0–37.5)
MCV (HT): 97 fL (ref 80–100)
Mean Corpuscular Hemoglobin (MCH) (HT): 32.8 pg (ref 26.0–34.0)
Monocyte # (HT): 0.3 10 3/uL (ref 0.1–1.0)
Monocyte % (HT): 10 % (ref 0–15)
Neutrophil # (HT): 1.8 10 3/uL (ref 1.8–8.0)
Platelets (HT): 153 10 3/uL (ref 150–450)
RBC (HT): 3.08 10 6/uL — ABNORMAL LOW (ref 3.80–5.20)
RDW (HT): 12.8 % (ref 0.0–15.2)
Seg Neut % (HT): 69 % (ref 45–75)
WBC (HT): 2.6 10 3/uL — ABNORMAL LOW (ref 4.0–11.0)

## 2024-04-18 MED ORDER — HYLAN 48 MG/6ML IX SOSY *I*
48.0000 mg | PREFILLED_SYRINGE | Freq: Once | INTRA_ARTICULAR | Status: AC | PRN
Start: 2024-04-15 — End: 2024-04-15
  Administered 2024-04-15: 48 mg via INTRA_ARTICULAR

## 2024-04-18 MED ORDER — LIDOCAINE HCL 1 % IJ SOLN *I*
1.5000 mL | Freq: Once | INTRAMUSCULAR | Status: AC | PRN
Start: 2024-04-15 — End: 2024-04-15
  Administered 2024-04-15: 1.5 mL via INTRA_ARTICULAR

## 2024-04-18 MED ORDER — LIDOCAINE HCL 1 % IJ SOLN *I*
1.0000 mL | Freq: Once | INTRAMUSCULAR | Status: AC | PRN
Start: 2024-04-15 — End: 2024-04-15
  Administered 2024-04-15: 1 mL via INTRA_ARTICULAR

## 2024-04-18 MED ORDER — LIDOCAINE HCL 1 % IJ SOLN *I*
0.5000 mL | Freq: Once | INTRAMUSCULAR | Status: AC | PRN
Start: 2024-04-15 — End: 2024-04-15
  Administered 2024-04-15: .5 mL via INTRA_ARTICULAR

## 2024-04-18 NOTE — Procedures (Signed)
 Large Joint Aspiration/Injection Procedure: R knee intra - articularDate/Time: 04/15/2024 11:20 AM EDTConsent given by: patientSite marked: site markedTimeout: Immediately prior to procedure a time out was called to verify the correct patient, procedure, equipment, support staff and site/side marked as required Procedure DetailsLocation: knee - R knee intra - articularPreparation: The site was prepped using the usual aseptic technique.Ultrasound guidance:  Ultrasound was utilized to improve needle visualization, injection accuracy, and anatomic localization.  Anesthetics administered:  1.5 mL lidocaine  HCL 1 %; 1 mL lidocaine  HCL 1 %; 0.5 mL lidocaine  HCL 1 %Viscosupplementation & other medications administered:  48 mg hylan 48 MG/6MLAspirate amount: 15 mLAspirate: yellow, serous and blood-tingedDressing:  A dry, sterile dressing was applied.Patient tolerance: patient tolerated the procedure well with no immediate complications

## 2024-05-10 LAB — UNMAPPED LAB RESULTS
Basophil # (HT): 0 10 3/uL (ref 0.0–0.2)
Basophil % (HT): 1 % (ref 0–3)
Eosinophil # (HT): 0 10 3/uL (ref 0.0–0.6)
Eosinophil % (HT): 2 % (ref 0–5)
Hematocrit (HT): 32 % — ABNORMAL LOW (ref 35–47)
Hemoglobin (HGB) (HT): 11 g/dL — ABNORMAL LOW (ref 12.0–16.0)
Lymphocyte # (HT): 0.5 10 3/uL — ABNORMAL LOW (ref 1.0–4.8)
Lymphocyte % (HT): 18 % (ref 15–45)
MCHC (HT): 34.2 g/dL (ref 31.0–37.5)
MCV (HT): 94 fL (ref 80–100)
Mean Corpuscular Hemoglobin (MCH) (HT): 32.3 pg (ref 26.0–34.0)
Monocyte # (HT): 0.2 10 3/uL (ref 0.1–1.0)
Monocyte % (HT): 9 % (ref 0–15)
Neutrophil # (HT): 1.8 10 3/uL (ref 1.8–8.0)
Platelets (HT): 163 10 3/uL (ref 150–450)
RBC (HT): 3.41 10 6/uL — ABNORMAL LOW (ref 3.80–5.20)
RDW (HT): 12.6 % (ref 0.0–15.2)
Seg Neut % (HT): 70 % (ref 45–75)
WBC (HT): 2.6 10 3/uL — ABNORMAL LOW (ref 4.0–11.0)

## 2024-06-08 LAB — UNMAPPED LAB RESULTS
Basophil # (HT): 0 10 3/uL (ref 0.0–0.2)
Basophil % (HT): 1 % (ref 0–3)
Eosinophil # (HT): 0.1 10 3/uL (ref 0.0–0.6)
Eosinophil % (HT): 3 % (ref 0–5)
Hematocrit (HT): 30 % — ABNORMAL LOW (ref 35–47)
Hemoglobin (HGB) (HT): 10.1 g/dL — ABNORMAL LOW (ref 12.0–16.0)
Lymphocyte # (HT): 0.5 10 3/uL — ABNORMAL LOW (ref 1.0–4.8)
Lymphocyte % (HT): 19 % (ref 15–45)
MCHC (HT): 33.8 g/dL (ref 31.0–37.5)
MCV (HT): 96 fL (ref 80–100)
Mean Corpuscular Hemoglobin (MCH) (HT): 32.4 pg (ref 26.0–34.0)
Monocyte # (HT): 0.3 10 3/uL (ref 0.1–1.0)
Monocyte % (HT): 10 % (ref 0–15)
Neutrophil # (HT): 1.9 10 3/uL (ref 1.8–8.0)
Platelets (HT): 155 10 3/uL (ref 150–450)
RBC (HT): 3.12 10 6/uL — ABNORMAL LOW (ref 3.80–5.20)
RDW (HT): 13.2 % (ref 0.0–15.2)
Seg Neut % (HT): 67 % (ref 45–75)
WBC (HT): 2.8 10 3/uL — ABNORMAL LOW (ref 4.0–11.0)

## 2024-07-05 ENCOUNTER — Ambulatory Visit: Payer: Medicare (Managed Care) | Admitting: Optometrist

## 2024-07-05 ENCOUNTER — Encounter: Payer: Self-pay | Admitting: Optometrist

## 2024-07-05 ENCOUNTER — Other Ambulatory Visit: Payer: Self-pay

## 2024-07-05 DIAGNOSIS — Z961 Presence of intraocular lens: Secondary | ICD-10-CM

## 2024-07-05 DIAGNOSIS — E119 Type 2 diabetes mellitus without complications: Secondary | ICD-10-CM

## 2024-07-05 DIAGNOSIS — H40003 Preglaucoma, unspecified, bilateral: Secondary | ICD-10-CM

## 2024-07-05 DIAGNOSIS — H52203 Unspecified astigmatism, bilateral: Secondary | ICD-10-CM

## 2024-07-05 DIAGNOSIS — H43393 Other vitreous opacities, bilateral: Secondary | ICD-10-CM

## 2024-07-05 DIAGNOSIS — H524 Presbyopia: Secondary | ICD-10-CM

## 2024-07-05 NOTE — Progress Notes (Signed)
 Outpatient VisitPatient name: Christina [name]: 1946/05/07       Age: 78 y.o.MR#: [id number] Encounter Date: 10/28/2025Subjective: Chief Complaint Patient presents with  New Patient Visit HPI  Christina [name] is a 78 y.o. female here for NPV.Pt states she just recently moved from [place]  and is looking for a new optometrist, Vision has been declining mostly in OS. Denies eye pain,Redness, Flashes, New floaters Last HbA1C: did not check Hemoglobin A1C (%)     Date                     Value       09/22/2023                   7.2Ocular MedsNone Ocular HxCataract surgey done by Dr.Digby PCO (posterior capsular opacification), bilateralDermatochalasis of both upper eyelidsPosterior vitreous detachment of both eyesPresbyopia of both eyes Last edited by Shellia Meadow, OD on 07/05/2024  1:39 PM.  has a current medication list which includes the following prescription(s): abemaciclib, aspirin, epinephrine, ergocalciferol, letrozole, potassium chloride sa, benzonatate , onetouch verio, hydrochlorothiazide, amlodipine, and potassium chloride. is allergic to lisinopril and acetaminophen.  Past Medical History[1] Past Surgical History[2] Specialty Problems    Ophthalmology Problems  Controlled type 2 diabetes mellitus without complication    Dermatochalasis of both upper eyelids    PCO (posterior capsular opacification), bilateral    Posterior vitreous detachment of both eyes    Presbyopia of both eyes    Pseudophakia of both eyes    ROS  Positive for: Endocrine, EyesNegative for: Constitutional, Gastrointestinal, Neurological, Skin, Genitourinary, Musculoskeletal, HENT, Cardiovascular, Respiratory, Psychiatric, Allergic/Imm, Heme/LymphLast edited by Shellia Meadow, OD on 07/05/2024  1:39 PM.   Objective: Base Eye Exam   Visual Acuity (Snellen - Linear)      Right Left  Dist sc 20/30 -1 20/25  Near sc J7 OU    Tonometry (Tonopen, 1:34 PM)     Right Left  Pressure 13 12    Pupils     Dark Light Shape React APD  Right 4 3 Round Slow None  Left 4 3 Round Slow None    Visual Fields     Left Right   Full Full    Extraocular Movement     Right Left   Full Full    Neuro/Psych   Oriented x3: Yes  Mood/Affect: Normal    Dilation   Both eyes: 2.5% Phenylephrine, 1.0% Tropicamide, 0.5% Proparacaine @ 1:37 PM    Slit Lamp and Fundus Exam   External Exam     Right Left  External Normal ocular adnexae, lacrimal gland & drainage, orbits Normal ocular adnexae, lacrimal gland & drainage, orbits    Slit Lamp Exam     Right Left  Lids/Lashes Normal structure & position Normal structure & position  Conjunctiva/Sclera Normal bulbar/palpebral, conjunctiva, sclera Normal bulbar/palpebral, conjunctiva, sclera  Cornea Normal epithelium, stroma, endothelium, tear film Normal epithelium, stroma, endothelium, tear film  Anterior Chamber Clear & deep Clear & deep  Iris Normal shape, size, morphology Normal shape, size, morphology  Lens PCIOL, tr PCO PCIOL , tr PCO  Anterior Vitreous syneresis syneresis    Fundus Exam     Right Left  Disc tilted indistinct margins  C/D Ratio 0.6 0.7  Macula Normal Normal  Vessels Normal Normal  Periphery Normal Normal    Refraction   Wearing Rx     Sphere Cylinder Axis  Right +3.00 -0.50 055  Left +2.25 Sphere     Manifest Refraction (  Auto)     Sphere Cylinder Axis Dist VA Add Near [place]  Right +0.75 -0.25 179     Left +0.75 -1.00 042       Manifest Refraction #2 (Subjective)     Sphere Cylinder Axis Dist VA Add [place]  Right +0.25 -0.25 179 20/25 +2.50 J1+  Left +0.75 -0.75 038 20/25 +2 +2.50 J1+    Final Rx     Sphere  Cylinder Axis Dist VA Add  Right +0.25 -0.25 179 20/25 +2.50  Left +0.75 -0.75 038 20/25 +2 +2.50  Expiration Date: 07/05/2025 Educated vision wont be perfect even with best possible specs due to ocular condition(s)    Final Rx     Sphere Cylinder Axis Dist VA Add  Right +0.25 -0.25 179 20/25 +2.50  Left +0.75 -0.75 038 20/25 +2 +2.50  Expiration Date: 07/05/2025 Educated vision wont be perfect even with best possible specs due to ocular condition(s)    Assessment/Plan:  1. Astigmatism of both eyes with presbyopia    2. Pseudophakia of both eyes    3. DM type 2 without retinopathy    4. Glaucoma suspect of both eyes    5. Vitreous syneresis of both eyes     PLAN:1-2: Spectacle Rx Given today. Educated about adaptation period with new specs .  Various lenses options explained  . Educated vision wont be perfect even with best possible specs due to ocular condition(s) 3. Educated about importance of Blood sugar, Blood pressure and lipid control and monitoring with PCP on a regular basis 4. Educated. + F.hx of glaucoma: mother. SABRARTC within 3 months for  Long med follow up VF 24-2 SS , OCT RNFL , pachy 5. Educated. Educated about floaters , flashes.  Educated about signs and symptoms of retina break . 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 (cT2, cN1(f), cM0, G2, ER+, PR+, HER2-)  Site 1: Q-Clip 2:00 mass 6 cfn, accurate deployment of clip-invasive ductal carcinoma, grade 2, ER PR positive HER2 equivocal, not  amplified via FISH Left axillary lymph node: Vision clip, accurate deployment -invasive ductal carcinoma, grade  Breast cancer of upper-outer quadrant of left female breast 09/03/2022  Controlled type 2 diabetes mellitus without complication 08/13/2015  Dermatochalasis of both upper eyelids 06/27/2019  Diabetes   Essential hypertension 05/25/2014  Essential tremor 10/20/2021  High blood pressure   Hyperlipidemia 05/25/2014  PCO (posterior capsular opacification), bilateral 06/27/2019  Posterior vitreous detachment of both eyes 06/27/2019  Presbyopia of both eyes 06/27/2019  Pseudophakia of both eyes 06/27/2019  Renal cyst 02/13/2018  Respiratory obstruction 06/12/2019  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

## 2024-07-06 LAB — UNMAPPED LAB RESULTS
Basophil # (HT): 0 10 3/uL (ref 0.0–0.2)
Basophil % (HT): 1 % (ref 0–3)
Eosinophil # (HT): 0.1 10 3/uL (ref 0.0–0.6)
Eosinophil % (HT): 3 % (ref 0–5)
Hematocrit (HT): 31 % — ABNORMAL LOW (ref 35–47)
Hemoglobin (HGB) (HT): 10.6 g/dL — ABNORMAL LOW (ref 12.0–16.0)
Lymphocyte # (HT): 0.5 10 3/uL — ABNORMAL LOW (ref 1.0–4.8)
Lymphocyte % (HT): 18 % (ref 15–45)
MCHC (HT): 34.2 g/dL (ref 31.0–37.5)
MCV (HT): 94 fL (ref 80–100)
Mean Corpuscular Hemoglobin (MCH) (HT): 32.2 pg (ref 26.0–34.0)
Monocyte # (HT): 0.3 10 3/uL (ref 0.1–1.0)
Monocyte % (HT): 13 % (ref 0–15)
Neutrophil # (HT): 1.7 10 3/uL — ABNORMAL LOW (ref 1.8–8.0)
Platelets (HT): 148 10 3/uL — ABNORMAL LOW (ref 150–450)
RBC (HT): 3.29 10 6/uL — ABNORMAL LOW (ref 3.80–5.20)
RDW (HT): 13.6 % (ref 0.0–15.2)
Seg Neut % (HT): 64 % (ref 45–75)
WBC (HT): 2.6 10 3/uL — ABNORMAL LOW (ref 4.0–11.0)

## 2024-07-15 NOTE — Progress Notes (Signed)
 [facility] Follow up NoteDear Dr. Rosabel, Dionne Natalie, MD,We recently had the opportunity to see Christina [name] at the [facility] for a follow up of essential tremor.  As you know, this is a 78 y.o. right-handed woman with a past medical history notable for essential tremor, HTN, DM2, and kidney stones.  She is unaccompanied. *Patient verbally consented to the use of [employer] during our encounter* Content from DAX was then personally edited for accuracy.History of Present IllnessShe reports a progressive worsening of her tremors over the past few months, which have now become more pronounced in her right hand than her left. The tremors are severe enough to interfere with her daily activities, such as using utensils, writing, and holding cups. She also experiences a voice tremor but notes that her gait remains unaffected. She does not experience any balance issues, confusion, or cognitive impairment. She expresses concern about the potential genetic transmission of this condition to her daughter, who is currently asymptomatic. Current tremor meds:- None Prior meds tried:- Propranolol LA up to 120 mg daily - made her lightheaded and felt foggy- Primidone  50mg  nightly - felt fatigued Contraindicated:Topamax: had a kidney stone 40 years agoSocial History:  - Worked as a marine scientist, several places; she stopped working in 2015- EtOH use: occasionally, no changes on tremorFamily History:- dad had tremor, handwriting deteriorated very badlyMedications Ordered Prior to Encounter[1]Allergies[2]EXAMINATION:There were no vitals filed for this visit. Neurological:Mental Status: Awake and alert. Oriented to person, place and time.  Fluent language. Answering questions appropriately. Comprehension intact.  Attention intact.  Appropriate fund of knowledge.  Affect is  appropriate for situation.CN:  No facial droop. There is no hypomimia. No hypophonia. No dysarthria or dysphonia. Moderate vocal tremor on casual conversation. Motor: Tone:  no rigidity. Abnormal movements: there is slight rest tremor with distraction only, right>left hand. Slight-mild postural tremor in both hands (MCP flexion/extension), right>left. moderate intention on right hand, slight intention tremor on left hand. Mild-moderate kinetic tremor. Coordination: No dysmetria on finger-to-nose bilaterally.Gait:  Casual gait is stable with normal base, stride length, and arm swing. IMPRESSION:Christina [name] is a 78 y.o. right-handed woman with a history notable for essential tremor, nephrolithiasis, HTN, and DM2, who presents for follow up of essential tremor. She is having functional impairment from her tremor. Unfortunately, she had side effects from propranolol and primidone  and topamax is contraindicated. Will start gabapentin which is second line for ET. Can discuss DBS in the future, if gabapentin fails. PLAN:- Start gabapentin 300 mg nightly (start 100 mg nightly for 1 week, then 200 mg nightly for 1 week, then 300 mg nightly)-mychart in 4-6 weeks, can uptitrate as toleratedMelanie Eirene Rather, MDMovement Disorders 10:33 AM, 07/15/2024 [1] Current Outpatient Medications on File Prior to Visit Medication Sig Dispense Refill  abemaciclib (VERZENIO) 150 MG TABS tablet Take 1 tablet (150 mg total) by mouth 2 times daily.    aspirin 81 mg EC tablet Take 1 tablet (81 mg total) by mouth daily.    ERGOCALCIFEROL 1.25 MG (50000 UT) capsule Take 1 capsule (50,000 units total) by mouth once a week.    letrozole (FEMARA) 2.5 mg tablet Take 1 tablet (2.5 mg total) by mouth daily.    potassium chloride SA (KLOR-CON M20) 20 mEq  tablet Take 1 tablet (20 mEq total) by mouth daily.    benzonatate  (TESSALON ) 200 mg capsule Take 1 capsule (200 mg total) by mouth 3 times  daily as needed for Cough 30 capsule  0  blood glucose (ONETOUCH VERIO) test strip TEST BLOOD SUGAR ONCE PER DAY DX: E11.9    hydroCHLOROthiazide (HYDRODIURIL) 25 mg tablet Take 1 tablet (25 mg total) by mouth daily.    amLODIPine (NORVASC) 5 mg tablet Take 1 tablet (5 mg total) by mouth daily.    potassium chloride (KLOR-CON) 20 MEQ packet Take 20 mEq by mouth 2 times daily.   No current facility-administered medications on file prior to visit. [2] AllergiesAllergen Reactions  Lisinopril Other (See Comments)   angioedema  Acetaminophen Hives

## 2024-07-28 ENCOUNTER — Ambulatory Visit: Payer: Medicare (Managed Care) | Attending: Neurology | Admitting: Neurology

## 2024-07-28 ENCOUNTER — Other Ambulatory Visit: Payer: Self-pay

## 2024-07-28 VITALS — BP 145/79 | HR 78 | Temp 97.3°F | Wt 154.2 lb

## 2024-07-28 DIAGNOSIS — G250 Essential tremor: Secondary | ICD-10-CM | POA: Insufficient documentation

## 2024-07-28 MED ORDER — GABAPENTIN 100 MG PO CAPSULE *I*
300.0000 mg | ORAL_CAPSULE | Freq: Every evening | ORAL | 2 refills | Status: AC
Start: 2024-07-28 — End: ?

## 2024-07-28 NOTE — Patient Instructions (Signed)
-   Start gabapentin 300 mg nightly (start 100 mg nightly for 1 week, then 200 mg nightly for 1 week, then 300 mg nightly)

## 2024-08-09 LAB — UNMAPPED LAB RESULTS
Basophil # (HT): 0 10 3/uL (ref 0.0–0.2)
Basophil % (HT): 1 % (ref 0–3)
Eosinophil # (HT): 0 10 3/uL (ref 0.0–0.6)
Eosinophil % (HT): 1 % (ref 0–5)
Hematocrit (HT): 32 % — ABNORMAL LOW (ref 35–47)
Hemoglobin (HGB) (HT): 11.2 g/dL — ABNORMAL LOW (ref 12.0–16.0)
Lymphocyte # (HT): 0.6 10 3/uL — ABNORMAL LOW (ref 1.0–4.8)
Lymphocyte % (HT): 19 % (ref 15–45)
MCHC (HT): 34.9 g/dL (ref 31.0–37.5)
MCV (HT): 95 fL (ref 80–100)
Mean Corpuscular Hemoglobin (MCH) (HT): 33 pg (ref 26.0–34.0)
Monocyte # (HT): 0.2 10 3/uL (ref 0.1–1.0)
Monocyte % (HT): 7 % (ref 0–15)
Neutrophil # (HT): 2.1 10 3/uL (ref 1.8–8.0)
Platelets (HT): 183 10 3/uL (ref 150–450)
RBC (HT): 3.39 10 6/uL — ABNORMAL LOW (ref 3.80–5.20)
RDW (HT): 13.2 % (ref 0.0–15.2)
Seg Neut % (HT): 71 % (ref 45–75)
WBC (HT): 3 10 3/uL — ABNORMAL LOW (ref 4.0–11.0)

## 2024-08-22 ENCOUNTER — Other Ambulatory Visit: Payer: Medicare (Managed Care)

## 2024-09-13 ENCOUNTER — Other Ambulatory Visit: Payer: Self-pay

## 2024-09-13 ENCOUNTER — Ambulatory Visit: Payer: Medicare (Managed Care) | Admitting: Sports Medicine

## 2024-09-13 VITALS — BP 156/90 | HR 68

## 2024-09-13 DIAGNOSIS — M1711 Unilateral primary osteoarthritis, right knee: Secondary | ICD-10-CM

## 2024-09-13 NOTE — Progress Notes (Signed)
 [facility] [employer] is a 79 y.o. female who presents for Follow-up of the Right KneeHistory of Present IllnessThe patient is a 79 year old female who presents for a follow-up of right knee pain.She had a Synvisc-1 injection in this knee on 04/15/24.She reports that her condition has not significantly improved since the last injection, which was administered 5 months ago. The relief from the injection was minimal and short-lived. She experiences pain in her right knee, which she manages with difficulty. Morning stiffness and discomfort are also present. She has not received any cortisone injections in the past. She recalls that her previous Synvisc-1 injections provided more relief than the past one.She would be open to trying a 3 shot visco series instead. Objective Blood pressure 156/90, pulse 68.Physical ExamMusculoskeletal:Right knee: Swelling and signs of arthritisResultsImaging - X-rays of the right knee: 11/2022, Bone on bone contact in the lateral compartment.  Assessment & Plan1. Right knee pain:The patient's right knee pain has worsened over the past few months. Previous Synvisc-One injection in August 2025 provided minimal relief. 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 A three-shot Euflexxa injection series will be initiated in 10/2024, pending insurance approval. If the knee is swollen during the first injection, fluid will be drained before administering the gel. If the Euflexxa injections do not provide sufficient relief, a steroid injection will be considered. If conservative treatments fail, knee replacement surgery may be considered as a last resort.Follow-up: The patient will follow up in 10/2024, will be contacted to schedule once approved. Author: Corean Earnie Snide, DO  Note signed:  09/13/2024

## 2024-09-14 ENCOUNTER — Ambulatory Visit: Payer: Medicare (Managed Care) | Admitting: Optometrist

## 2024-09-14 ENCOUNTER — Ambulatory Visit: Payer: Medicare (Managed Care)

## 2024-09-19 NOTE — Telephone Encounter (Signed)
 Patient is all set  - coming in on 09/22/24 @ 1:15 pm at [facility] called and aware

## 2024-09-22 LAB — UNMAPPED LAB RESULTS
Basophil # (HT): 0 10*3/uL (ref 0.0–0.2)
Basophil % (HT): 2 % (ref 0–3)
Eosinophil # (HT): 0 10*3/uL (ref 0.0–0.6)
Eosinophil % (HT): 0 % (ref 0–5)
Hematocrit (HT): 33 % — ABNORMAL LOW (ref 35–47)
Hemoglobin (HGB) (HT): 11.2 g/dL — ABNORMAL LOW (ref 12.0–16.0)
Lymphocyte # (HT): 0.4 10*3/uL — ABNORMAL LOW (ref 1.0–4.8)
Lymphocyte % (HT): 18 % (ref 15–45)
MCHC (HT): 33.5 g/dL (ref 31.0–37.5)
MCV (HT): 94 fL (ref 80–100)
Mean Corpuscular Hemoglobin (MCH) (HT): 31.6 pg (ref 26.0–34.0)
Monocyte # (HT): 0.2 10*3/uL (ref 0.1–1.0)
Monocyte % (HT): 8 % (ref 0–15)
Neutrophil # (HT): 1.4 10*3/uL — ABNORMAL LOW (ref 1.8–8.0)
Platelets (HT): 138 10*3/uL — ABNORMAL LOW (ref 150–450)
RBC (HT): 3.54 10*6/uL — ABNORMAL LOW (ref 3.80–5.20)
RDW (HT): 12.7 % (ref 0.0–15.2)
Seg Neut % (HT): 72 % (ref 45–75)
WBC (HT): 2 10*3/uL — ABNORMAL LOW (ref 4.0–11.0)

## 2024-10-12 ENCOUNTER — Other Ambulatory Visit: Payer: Self-pay

## 2024-10-12 ENCOUNTER — Ambulatory Visit: Payer: Medicare (Managed Care) | Admitting: Sports Medicine

## 2024-10-12 VITALS — BP 145/98 | HR 79 | Ht 64.0 in | Wt 154.0 lb

## 2024-10-12 DIAGNOSIS — M1711 Unilateral primary osteoarthritis, right knee: Secondary | ICD-10-CM

## 2024-10-12 MED ORDER — LIDOCAINE HCL 1 % IJ SOLN *I*
5.0000 mL | Freq: Once | INTRAMUSCULAR | Status: AC | PRN
  Administered 2024-10-12: 5 mL via INTRA_ARTICULAR

## 2024-10-12 MED ORDER — LIDOCAINE HCL 1 % IJ SOLN *I*
1.0000 mL | Freq: Once | INTRAMUSCULAR | Status: AC | PRN
  Administered 2024-10-12: 1 mL via INTRA_ARTICULAR

## 2024-10-12 MED ORDER — BETAMETHASONE ACET & SOD PHOS 6 (3-3) MG/ML IJ SUSP *I*
12.0000 mg | Freq: Once | INTRAMUSCULAR | Status: AC | PRN
  Administered 2024-10-12: 12 mg via INTRA_ARTICULAR

## 2024-10-12 NOTE — Procedures (Signed)
 Large Joint Aspiration/Injection Procedure: R knee intra - articularDate/Time: 10/12/2024 11:20 AM ESTConsent given by: patientSite marked: site markedTimeout: Immediately prior to procedure a time out was called to verify the correct patient, procedure, equipment, support staff and site/side marked as required Procedure DetailsLocation: knee - R knee intra - articularPreparation: The site was prepped using the usual aseptic technique.Ultrasound guidance:  Ultrasound was utilized to improve needle visualization, injection accuracy, and anatomic localization.  Anesthetics administered:  1 mL lidocaine  HCL 1 %; 5 mL lidocaine  HCL 1 %Intra-Articular Steroids administered:  12 mg betamethasone  acetate-betamethasone  sodium phosphate 6 (3-3) mg/mLAspirate amount: 6 mLAspirate: serous and yellowDressing:  A dry, sterile dressing was applied.Patient tolerance: patient tolerated the procedure well with no immediate complications

## 2024-10-19 ENCOUNTER — Ambulatory Visit: Payer: Medicare (Managed Care) | Admitting: Sports Medicine

## 2024-10-26 ENCOUNTER — Ambulatory Visit: Payer: Medicare (Managed Care) | Admitting: Sports Medicine

## 2024-10-27 ENCOUNTER — Ambulatory Visit: Payer: Medicare (Managed Care) | Admitting: Sports Medicine

## 2024-11-14 ENCOUNTER — Ambulatory Visit: Payer: Medicare (Managed Care)

## 2025-03-30 ENCOUNTER — Ambulatory Visit: Payer: Medicare (Managed Care) | Admitting: General Surgery
